# Patient Record
Sex: Female | Born: 1985 | Race: Black or African American | Hispanic: No | Marital: Married | Smoking: Current some day smoker
Health system: Southern US, Community
[De-identification: ages and names within clinical notes are randomized; demographics above are authoritative.]

## PROBLEM LIST (undated history)

## (undated) DIAGNOSIS — B019 Varicella without complication: Secondary | ICD-10-CM

## (undated) HISTORY — DX: Varicella without complication: B01.9

---
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
## 2004-01-25 ENCOUNTER — Observation Stay: Payer: Self-pay

## 2004-02-25 ENCOUNTER — Observation Stay: Payer: Self-pay

## 2004-02-26 ENCOUNTER — Observation Stay: Payer: Self-pay

## 2004-03-11 ENCOUNTER — Observation Stay: Payer: Self-pay

## 2004-03-22 ENCOUNTER — Observation Stay: Payer: Self-pay | Admitting: Unknown Physician Specialty

## 2004-03-22 ENCOUNTER — Emergency Department: Payer: Self-pay | Admitting: Emergency Medicine

## 2004-03-28 ENCOUNTER — Observation Stay: Payer: Self-pay

## 2004-03-31 ENCOUNTER — Inpatient Hospital Stay: Payer: Self-pay

## 2004-06-03 ENCOUNTER — Emergency Department: Payer: Self-pay | Admitting: General Practice

## 2005-09-29 ENCOUNTER — Observation Stay: Payer: Self-pay | Admitting: Obstetrics & Gynecology

## 2005-10-03 ENCOUNTER — Observation Stay: Payer: Self-pay | Admitting: Unknown Physician Specialty

## 2005-10-04 ENCOUNTER — Ambulatory Visit: Payer: Self-pay | Admitting: Unknown Physician Specialty

## 2005-10-24 ENCOUNTER — Observation Stay: Payer: Self-pay | Admitting: Unknown Physician Specialty

## 2005-10-31 ENCOUNTER — Observation Stay: Payer: Self-pay

## 2005-11-08 ENCOUNTER — Observation Stay: Payer: Self-pay | Admitting: Obstetrics & Gynecology

## 2005-11-13 ENCOUNTER — Observation Stay: Payer: Self-pay

## 2005-11-29 ENCOUNTER — Observation Stay: Payer: Self-pay

## 2005-12-01 ENCOUNTER — Inpatient Hospital Stay: Payer: Self-pay

## 2007-12-08 IMAGING — US US OB US >=[ID] SNGL FETUS
1 series · 17 of 28 positions shown · non-contrast
Comparison: none

REASON FOR EXAM: Pre term labor  CALL REPORT [id number] EXT:[id number]
COMMENTS:

[Series 1: us ob us >=(id) sngl fetus · 17 of 61 slices shown]
[im 1/61]
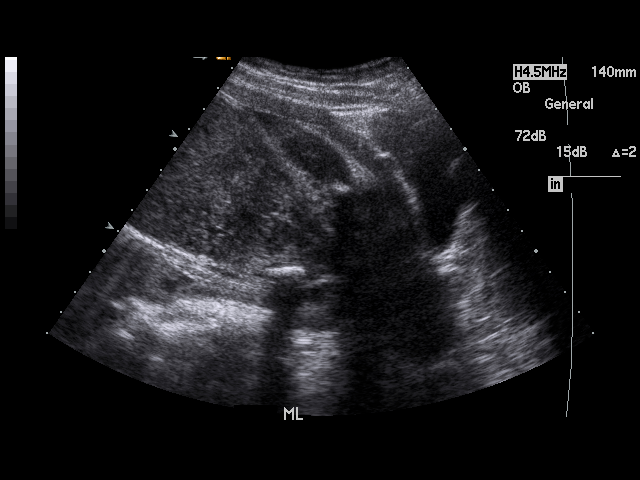
[im 5/61]
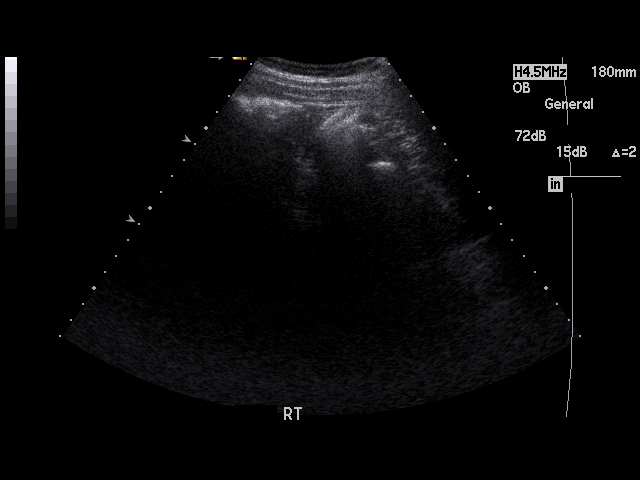
[im 9/61]
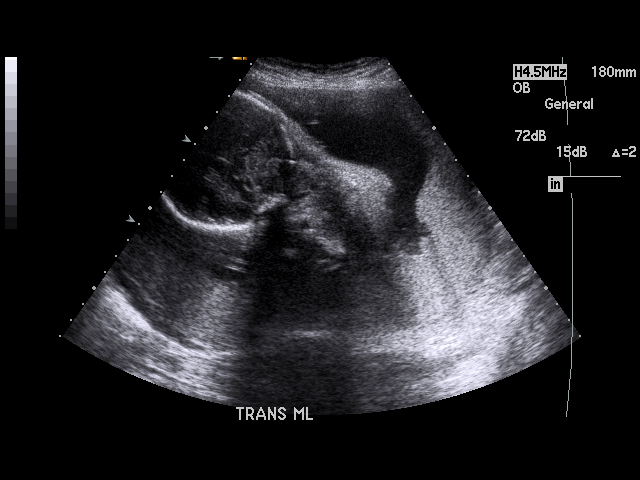
[im 12/61]
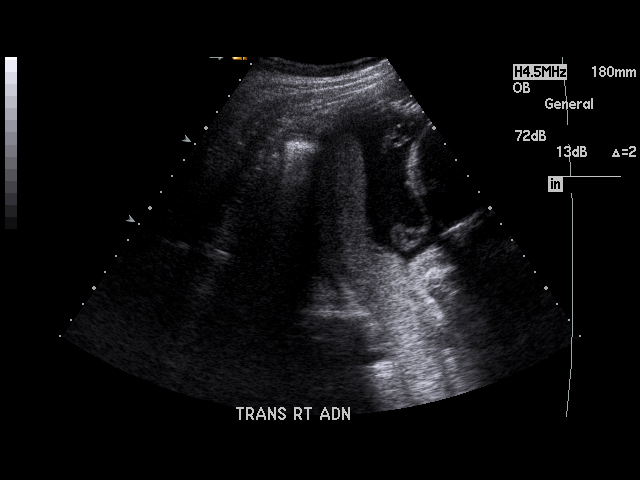
[im 16/61]
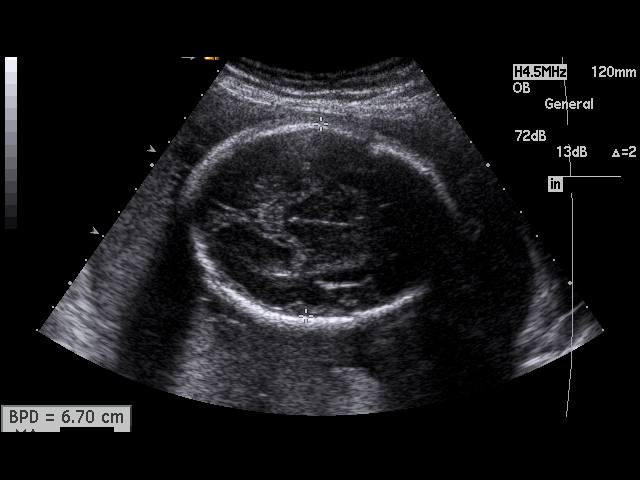
[im 21/61]
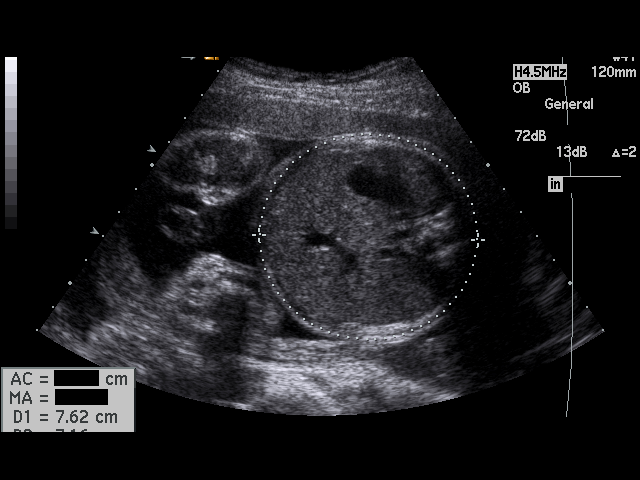
[im 23/61]
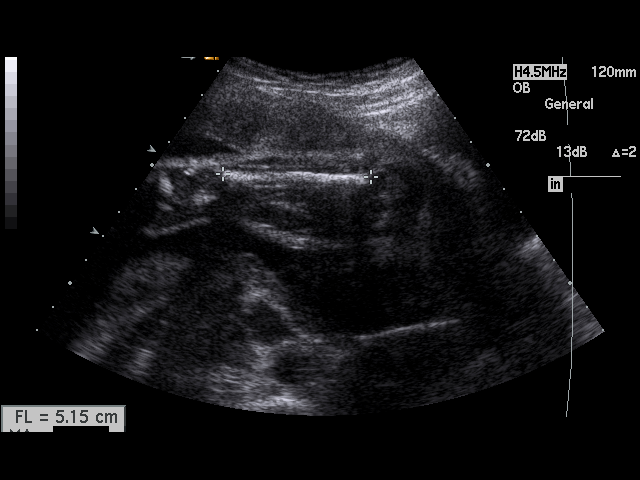
[im 27/61]
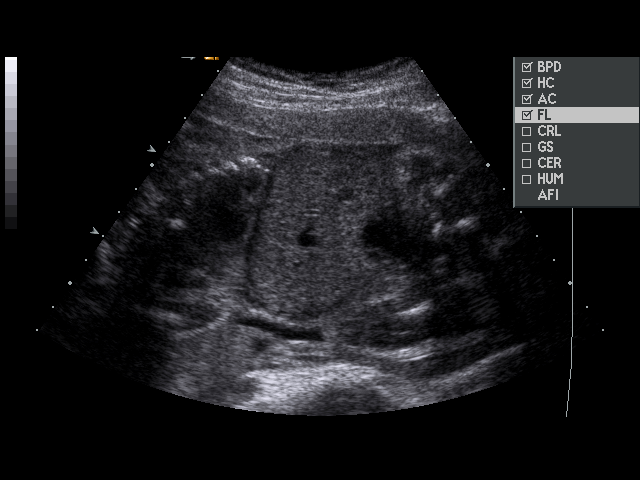
[im 32/61]
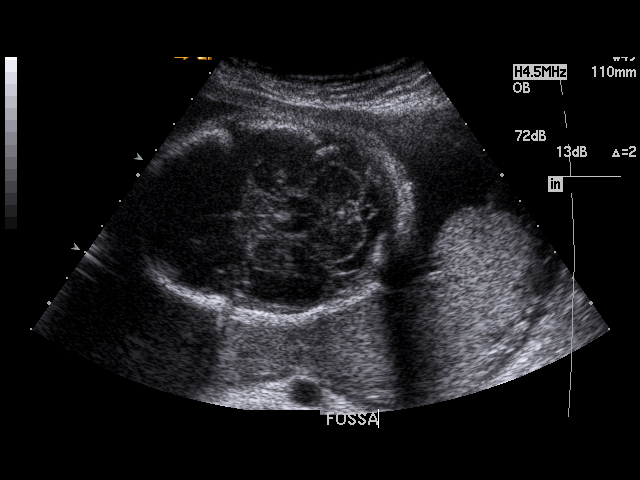
[im 34/61]
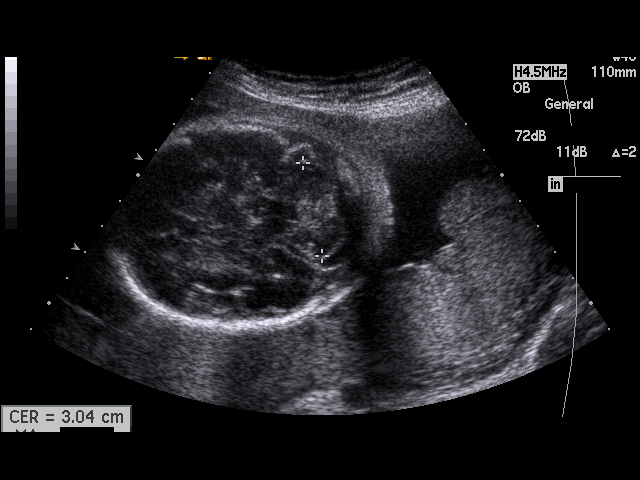
[im 38/61]
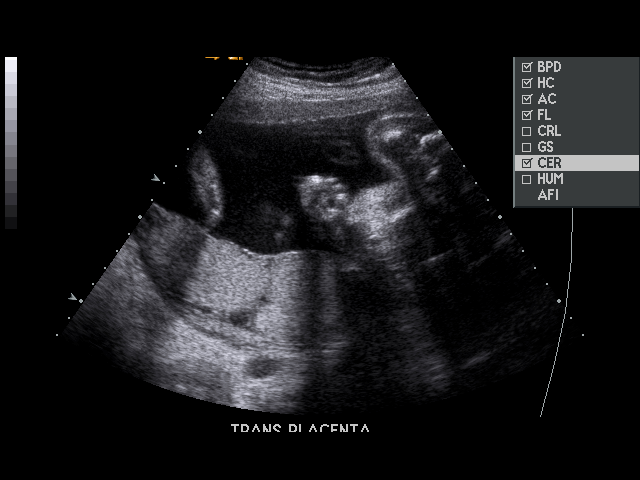
[im 41/61]
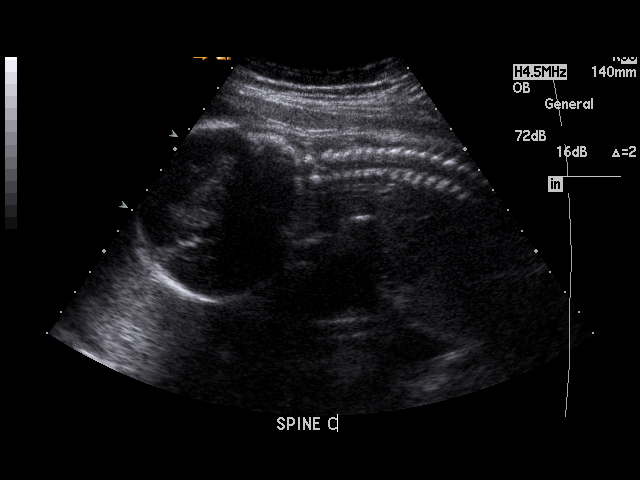
[im 45/61]
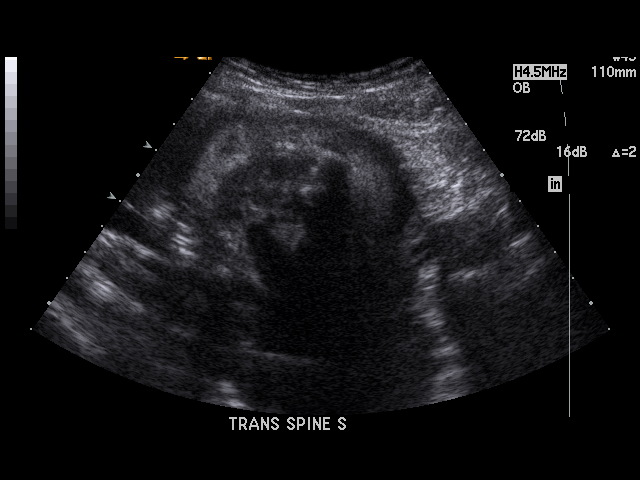
[im 49/61]
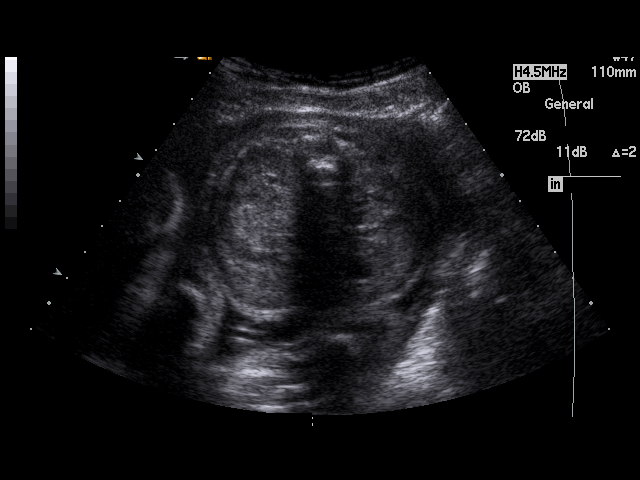
[im 52/61]
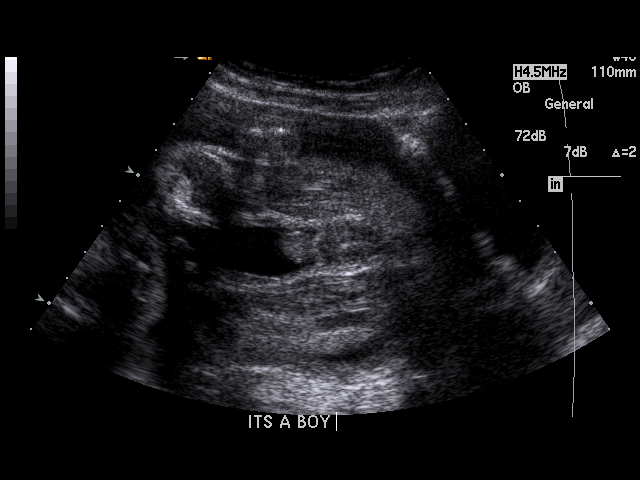
[im 56/61]
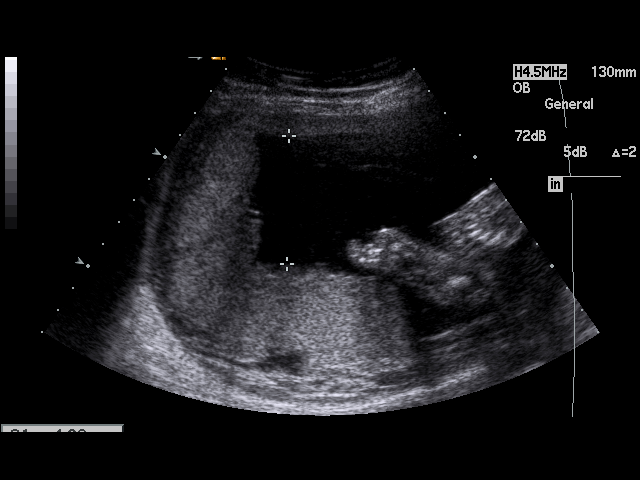
[im 61/61]
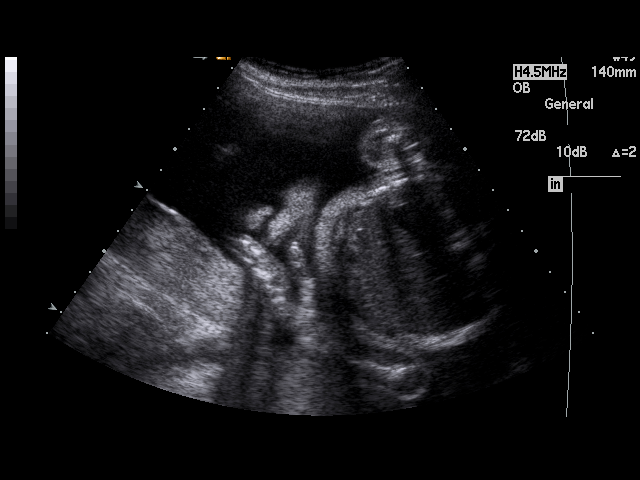

[17 of 28 positions shown; findings below may reference images not displayed]

PROCEDURE:     US  - US OB GREATER/OR EQUAL TO [id number]  - October 04, 2005  [DATE]

RESULT:          There is a living intrauterine gestation.  Presentation
currently is breech.  The placenta is posterior.  Amniotic fluid volume
appears normal.  Fetal heart rate was monitored at 144 beats per minute.
Fetal measurements are as follows.

BPD 6.79 cm (27 weeks, 2 days).
HC 25.54 cm (27 weeks, 5 days).
AC 23.31 cm (27 weeks, 5 days).
FL 5.08 cm (27 weeks, 2 days).
Estimated fetal weight equals 4354 grams.
AFI equals 15.38 cm, which is between the 50th and 95th percentile.

The fetal heart, stomach, and urinary bladder are visualized.  No
hydrocephalus or hydronephrosis is seen.  A normal-appearing three-vessel
cord is noted.
IMPRESSION: Please see above.

## 2010-11-11 ENCOUNTER — Observation Stay: Payer: Self-pay | Admitting: Obstetrics and Gynecology

## 2010-11-14 ENCOUNTER — Inpatient Hospital Stay: Payer: Self-pay | Admitting: Obstetrics and Gynecology

## 2010-11-15 HISTORY — PX: TUBAL LIGATION: SHX77

## 2010-11-18 LAB — PATHOLOGY REPORT

## 2012-01-03 ENCOUNTER — Emergency Department: Payer: Self-pay | Admitting: Unknown Physician Specialty

## 2013-03-09 ENCOUNTER — Emergency Department: Payer: Self-pay | Admitting: Emergency Medicine

## 2013-10-13 ENCOUNTER — Encounter: Payer: Self-pay | Admitting: Internal Medicine

## 2013-10-13 ENCOUNTER — Encounter (INDEPENDENT_AMBULATORY_CARE_PROVIDER_SITE_OTHER): Payer: Self-pay

## 2013-10-13 ENCOUNTER — Ambulatory Visit (INDEPENDENT_AMBULATORY_CARE_PROVIDER_SITE_OTHER): Payer: BC Managed Care – PPO | Admitting: Internal Medicine

## 2013-10-13 VITALS — BP 126/80 | HR 82 | Temp 98.1°F | Resp 16 | Ht 62.0 in | Wt 223.5 lb

## 2013-10-13 DIAGNOSIS — G43109 Migraine with aura, not intractable, without status migrainosus: Secondary | ICD-10-CM

## 2013-10-13 DIAGNOSIS — Z23 Encounter for immunization: Secondary | ICD-10-CM

## 2013-10-13 DIAGNOSIS — F4323 Adjustment disorder with mixed anxiety and depressed mood: Secondary | ICD-10-CM

## 2013-10-13 DIAGNOSIS — E669 Obesity, unspecified: Secondary | ICD-10-CM

## 2013-10-13 MED ORDER — SUMATRIPTAN SUCCINATE 25 MG PO TABS: 25.0000 mg | ORAL_TABLET | ORAL | Status: DC | PRN

## 2013-10-13 MED ORDER — ATENOLOL 25 MG PO TABS: 25.0000 mg | ORAL_TABLET | Freq: Every day | ORAL | Status: DC

## 2013-10-13 NOTE — Patient Instructions (Signed)
I am starting you on nightly atenolol for headache prevention  i am prescribing generic imitrex to treat your next migraine.  You can repeat the dose after 2 hours if the pain is not better   I want to help  you lose 60 lbs over the next 12 months through diet and exercise.  We will add an appetite suppressant if you are unable to lose any weight over the next month   This is  my version of a  "Low GI"  Diet:  It will still lower your blood sugars and allow you to lose 4 to 8  lbs  per month if you follow it carefully.  Your goal with exercise is a minimum of 30 minutes of aerobic exercise 5 days per week (Walking does not count once it becomes easy!)     All of the foods can be found at grocery stores and in bulk at [employer].  The Atkins protein bars and shakes are available in more varieties at Target, WalMart and [employer]'s Foods.     7 AM Breakfast:  Choose from the following:  Low carbohydrate Protein  Shakes (I recommend the EAS AdvantEdge "Carb Control" shakes  Or the low carb shakes by Atkins.    2.5 carbs   Arnold's "Sandwhich Thin"toasted  w/ peanut butter (no jelly: about 20 net carbs  "Bagel Thin" with cream cheese and salmon: about 20 carbs   a scrambled egg/bacon/cheese burrito made with Mission's "carb balance" whole wheat tortilla  (about 10 net carbs )  A slice of home made fritatta (egg based dish without a crust:  google it)    Avoid cereal and bananas, oatmeal and cream of wheat and grits. They are loaded with carbohydrates!   10 AM: high protein snack  Protein bar by Atkins (the snack size, under 200 cal, usually < 6 net carbs).    A stick of cheese:  Around 1 carb,  100 cal     Dannon Light n Fit [place]Greek Yogurt  (80 cal, 8 carbs)  Other so called "protein bars" and Greek yogurts tend to be loaded with carbohydrates.  Remember, in food advertising, the word "energy" is synonymous for " carbohydrate."  Lunch:   A Sandwich using the bread choices listed, Can use any  Eggs,   lunchmeat, grilled meat or canned tuna), avocado, regular mayo/mustard  and cheese.  A Salad using blue cheese, ranch,  Goddess or vinagrette,  No croutons or "confetti" and no "candied nuts" but regular nuts OK.   No pretzels or chips.  Pickles and miniature sweet peppers are a good low carb alternative that provide a "crunch"  The bread is the only source of carbohydrate in a sandwich and  can be decreased by trying some of these alternatives to traditional loaf bread  Joseph's makes a pita bread and a flat bread that are 50 cal and 4 net carbs available at BJs and WalMart.  This can be toasted to use with hummous as well  Toufayan makes a low carb flatbread that's 100 cal and 9 net carbs available at [employer] and [employer]  Mission makes 2 sizes of  Low carb whole wheat tortilla  (The large one is 210 cal and 6 net carbs)  Flat Out makes flatbreads that are low carb as well  Avoid "Low fat dressings, as well as Reyne DumasCatalina and [address] dressings They are loaded with sugar!   3 PM/ Mid day  Snack:  Consider  1 ounce of  almonds, walnuts, pistachios, pecans, peanuts,  Macadamia nuts or a nut medley.  Avoid "granola"; the dried cranberries and raisins are loaded with carbohydrates. Mixed nuts as long as there are no raisins,  cranberries or dried fruit.    Try the prosciutto/mozzarella cheese sticks by Fiorruci  In deli /backery section   High protein   To avoid overindulging in snacks: Try drinking a glass of unsweeted almond/coconut milk  Or a cup of coffee with your Atkins chocolate bar to keep you from having 3!!!   Pork rinds!  Yes Pork Rinds        6 PM  Dinner:     Meat/fowl/fish with a green salad, and either broccoli, cauliflower, green beans, spinach, brussel sprouts or  Lima beans. DO NOT BREAD THE PROTEIN!!      There is a low carb pasta by Dreamfield's that is acceptable and tastes great: only 5 digestible carbs/serving.( All grocery stores but BJs carry it )  Try Kai LevinsMichel Angelo's  chicken piccata or chicken or eggplant parm over low carb pasta.(Lowes and BJs)   Clifton CustardAaron Sanchez's "Carnitas" (pulled pork, no sauce,  0 carbs) or his beef pot roast to make a dinner burrito (at BJ's)  Pesto over low carb pasta (bj's sells a good quality pesto in the center refrigerated section of the deli   Try satueeing  Roosvelt HarpsBok Choy with mushroooms  Whole wheat pasta is still full of digestible carbs and  Not as low in glycemic index as Dreamfield's.   Brown rice is still rice,  So skip the rice and noodles if you eat Congohinese or [place] (or at least limit to 1/2 cup)  9 PM snack :   Breyer's "low carb" fudgsicle or  ice cream bar (Carb Smart line), or  Weight Watcher's ice cream bar , or another "no sugar added" ice cream;  a serving of fresh berries/cherries with whipped cream   Cheese or DANNON'S LlGHT N FIT GREEK YOGURT or the Oikos greek yogurt   8 ounces of [place] unsweetened almond/cococunut milk    Avoid bananas, pineapple, grapes  and watermelon on a regular basis because they are high in sugar.  THINK OF THEM AS DESSERT  Remember that snack Substitutions should be less than 10 NET carbs per serving and meals < 20 carbs. Remember to subtract fiber grams to get the "net carbs."

## 2013-10-13 NOTE — Progress Notes (Signed)
Pre-visit discussion using our clinic review tool. No additional management support is needed unless otherwise documented below in the visit note.  

## 2013-10-13 NOTE — Progress Notes (Signed)
Patient ID: Lisa Rosales, female   DOB: 03/09/1985, 28 y.o.   MRN: [id number]  Patient Active Problem List   Diagnosis Date Noted  . Obesity 10/14/2013  . Migraine headache with aura 10/14/2013  . Adjustment disorder with mixed anxiety and depressed mood 10/14/2013    Subjective:  CC:   Chief Complaint  Patient presents with  . Establish Care    HPI:   Lisa CraterChelsi N Tateis a 28 y.o. 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  The children in school are involved in sports activities after school which require parens to attend practices and meets several times per week.   History of migraines .  Averages 2/month.  Has h aura of seeing dots,  Last one was a week ago .  Missed a 1/2 day of work . Used advil for migraines 2 doses,  Light sensitive,  No fatigue.   bedtime 11:30 or 12  Up at 6 .  Sleepy during day? 4 /5 days,  Told she snores by husband,wakes up tired.  Wakes up at 9 am on weekends. Cranky when wakes up, having some mood swings,  Gets really cranky when tired,  Feels like she wants to be left alone.  4 kids at home from    Obesity:  Was 100 lbs in high school.  160 lbs prior to lat pregnancy 2 years ago,  Pregnancy was normal except for wt loss due to Hyperemesis gravidum  HM:  Last PAP was in 2012 6 weeks post vaginal delivery,  PAP was normal            Past Medical History  Diagnosis Date  . Chicken pox 1995   Allergies  Allergen Reactions  . Morphine And Related Palpitations    Sweats and halucinations     Past  Surgical History  Procedure Laterality Date  . Tubal ligation  11/15/2010    History   Social History  . Marital Status: Married    Spouse Name: N/A    Number of Children: N/A  . Years of Education: N/A   Occupational History  . Not on file.   Social History Main Topics  . Smoking status: Current Some Day Smoker    Types: Cigarettes    Start date: 10/14/2010  . Smokeless tobacco: Never Used     Comment: Occasionally  . Alcohol Use: Yes  . Drug Use: No  . Sexual Activity: Yes   Other Topics Concern  . Not on file   Social History Narrative  . No narrative on file    Family History  Problem Relation Age of Onset  . Arthritis Mother   . Early death Maternal Uncle [id number]    pulmonary embolism  . Seizures Maternal Uncle 25    25-30 seizure onset  . Hyperlipidemia Maternal Grandmother   . Hypertension Maternal Grandmother   . Stroke Maternal Grandmother   . Mental illness Maternal Grandmother   . Diabetes Maternal Grandmother   . Arthritis Maternal Grandfather   .  Hyperlipidemia Maternal Grandfather   . Heart disease Maternal Grandfather   . Hypertension Maternal Grandfather   . Diabetes Maternal Grandfather   . Cancer Paternal Grandmother   . Diabetes Paternal Grandmother   . Diabetes Paternal Grandfather       Review of Systems:   The rest of the review of systems was negative except those addressed in the HPI.    Objective:  BP 126/80  Pulse 82  Temp(Src) 98.1 F (36.7 C) (Oral)  Resp 16  Ht 5\' 2"  (1.575 m)  Wt 223 lb 8 oz (101.379 kg)  BMI 40.87 kg/m2  SpO2 97%  LMP 09/28/2013  General appearance: alert, cooperative and appears stated age Ears: normal TM's and external ear canals both ears Throat: lips, mucosa, and tongue normal; teeth and gums normal Neck: no adenopathy, no carotid bruit, supple, symmetrical, trachea midline and thyroid not enlarged, symmetric, no tenderness/mass/nodules Back: symmetric, no curvature. ROM normal. No CVA  tenderness. Lungs: clear to auscultation bilaterally Heart: regular rate and rhythm, S1, S2 normal, no murmur, click, rub or gallop Abdomen: soft, non-tender; bowel sounds normal; no masses,  no organomegaly Pulses: 2+ and symmetric Skin: Skin color, texture, turgor normal. No rashes or lesions Lymph nodes: Cervical, supraclavicular, and axillary nodes normal.  Assessment and Plan:  Obesity With snoring and headaches .  I have addressed  BMI and recommended a low glycemic index diet utilizing smaller more frequent meals to increase metabolism.  I have also recommended that patient start exercising with a goal of 30 minutes of aerobic exercise a minimum of 5 days per week. Time management and busy schedule identified as barriers to success.  Screening for lipid disorders, thyroid and diabetes to be done    Migraine headache with aura Averaging two per momth with loss of productivity noted.  Advised trial of atenolol qpm and imitrex prn   Adjustment disorder with mixed anxiety and depressed mood Patient's crankiness and fatigue appear to be manifestations of poor sleep and possibly OSA.  Deferred medication for now given risk of wt gain.  Will reassess in one month.    Updated Medication List Outpatient Encounter Prescriptions as of 10/13/2013  Medication Sig  . atenolol (TENORMIN) 25 MG tablet Take 1 tablet (25 mg total) by mouth daily. At bedtime  . SUMAtriptan (IMITREX) 25 MG tablet Take 1 tablet (25 mg total) by mouth every 2 (two) hours as needed for migraine or headache. May repeat in 2 hours if headache persists or recurs.  . [DISCONTINUED] SUMAtriptan (IMITREX) 25 MG tablet Take 1 tablet (25 mg total) by mouth every 2 (two) hours as needed for migraine or headache. May repeat in 2 hours if headache persists or recurs.     No orders of the defined types were placed in this encounter.    No Follow-up on file.

## 2013-10-14 ENCOUNTER — Encounter: Payer: Self-pay | Admitting: Internal Medicine

## 2013-10-14 ENCOUNTER — Telehealth: Payer: Self-pay | Admitting: *Deleted

## 2013-10-14 DIAGNOSIS — G43109 Migraine with aura, not intractable, without status migrainosus: Secondary | ICD-10-CM | POA: Insufficient documentation

## 2013-10-14 DIAGNOSIS — E669 Obesity, unspecified: Secondary | ICD-10-CM | POA: Insufficient documentation

## 2013-10-14 DIAGNOSIS — R5383 Other fatigue: Secondary | ICD-10-CM

## 2013-10-14 DIAGNOSIS — F4323 Adjustment disorder with mixed anxiety and depressed mood: Secondary | ICD-10-CM | POA: Insufficient documentation

## 2013-10-14 NOTE — Assessment & Plan Note (Signed)
Averaging two per momth with loss of productivity noted.  Advised trial of atenolol qpm and imitrex prn

## 2013-10-14 NOTE — Assessment & Plan Note (Addendum)
With snoring and headaches .  I have addressed  BMI and recommended a low glycemic index diet utilizing smaller more frequent meals to increase metabolism.  I have also recommended that patient start exercising with a goal of 30 minutes of aerobic exercise a minimum of 5 days per week. Time management and busy schedule identified as barriers to success.  Screening for lipid disorders, thyroid and diabetes to be done

## 2013-10-14 NOTE — Assessment & Plan Note (Signed)
Patient's crankiness and fatigue appear to be manifestations of poor sleep and possibly OSA.  Deferred medication for now given risk of wt gain.  Will reassess in one month.

## 2013-10-14 NOTE — Telephone Encounter (Signed)
Pt is coming in tomorrow what labs and dx?  

## 2013-10-15 ENCOUNTER — Other Ambulatory Visit (INDEPENDENT_AMBULATORY_CARE_PROVIDER_SITE_OTHER): Payer: BC Managed Care – PPO

## 2013-10-15 DIAGNOSIS — E669 Obesity, unspecified: Secondary | ICD-10-CM

## 2013-10-15 DIAGNOSIS — D509 Iron deficiency anemia, unspecified: Secondary | ICD-10-CM

## 2013-10-15 DIAGNOSIS — R5383 Other fatigue: Secondary | ICD-10-CM

## 2013-10-15 LAB — LIPID PANEL
CHOL/HDL RATIO: 5
Cholesterol: 211 mg/dL — ABNORMAL HIGH (ref 0–200)
HDL: 41.3 mg/dL (ref >39.00)
LDL Cholesterol: 151 mg/dL — ABNORMAL HIGH (ref 0–99)
NONHDL: 169.7
TRIGLYCERIDES: 96 mg/dL (ref 0.0–149.0)
VLDL: 19.2 mg/dL (ref 0.0–40.0)

## 2013-10-15 LAB — CBC WITH DIFFERENTIAL/PLATELET
BASOS ABS: 0.1 10*3/uL (ref 0.0–0.1)
Basophils Relative: 1.1 % (ref 0.0–3.0)
EOS ABS: 0.1 10*3/uL (ref 0.0–0.7)
Eosinophils Relative: 2.3 % (ref 0.0–5.0)
HCT: 35.4 % — ABNORMAL LOW (ref 36.0–46.0)
HEMOGLOBIN: 11.1 g/dL — AB (ref 12.0–15.0)
LYMPHS ABS: 2.1 10*3/uL (ref 0.7–4.0)
Lymphocytes Relative: 44.8 % (ref 12.0–46.0)
MCHC: 31.4 g/dL (ref 30.0–36.0)
MCV: 74.6 fl — AB (ref 78.0–100.0)
Monocytes Absolute: 0.6 10*3/uL (ref 0.1–1.0)
Monocytes Relative: 12.4 % — ABNORMAL HIGH (ref 3.0–12.0)
NEUTROS ABS: 1.9 10*3/uL (ref 1.4–7.7)
Neutrophils Relative %: 39.4 % — ABNORMAL LOW (ref 43.0–77.0)
Platelets: 322 10*3/uL (ref 150.0–400.0)
RBC: 4.75 Mil/uL (ref 3.87–5.11)
RDW: 19 % — ABNORMAL HIGH (ref 11.5–15.5)
WBC: 4.8 10*3/uL (ref 4.0–10.5)

## 2013-10-15 LAB — COMPREHENSIVE METABOLIC PANEL
ALT: 33 U/L (ref 0–35)
AST: 20 U/L (ref 0–37)
Albumin: 3.5 g/dL (ref 3.5–5.2)
Alkaline Phosphatase: 53 U/L (ref 39–117)
BUN: 11 mg/dL (ref 6–23)
CALCIUM: 9 mg/dL (ref 8.4–10.5)
CHLORIDE: 105 meq/L (ref 96–112)
CO2: 24 mEq/L (ref 19–32)
Creatinine, Ser: 0.7 mg/dL (ref 0.4–1.2)
GFR: 129.24 mL/min (ref >60.00)
Glucose, Bld: 83 mg/dL (ref 70–99)
POTASSIUM: 3.9 meq/L (ref 3.5–5.1)
Sodium: 135 mEq/L (ref 135–145)
Total Bilirubin: 0.7 mg/dL (ref 0.2–1.2)
Total Protein: 7.8 g/dL (ref 6.0–8.3)

## 2013-10-15 LAB — TSH: TSH: 0.51 u[IU]/mL (ref 0.35–4.50)

## 2013-10-15 LAB — VITAMIN B12: Vitamin B-12: 526 pg/mL (ref 211–911)

## 2013-10-16 ENCOUNTER — Encounter: Payer: Self-pay | Admitting: Internal Medicine

## 2013-10-16 DIAGNOSIS — D509 Iron deficiency anemia, unspecified: Secondary | ICD-10-CM | POA: Insufficient documentation

## 2013-10-16 NOTE — Addendum Note (Signed)
Addended by: Sherlene ShamsULLO, Loron Weimer L on: 10/16/2013 03:09 PM   Modules accepted: Orders

## 2013-11-26 ENCOUNTER — Ambulatory Visit (INDEPENDENT_AMBULATORY_CARE_PROVIDER_SITE_OTHER): Payer: BC Managed Care – PPO | Admitting: Internal Medicine

## 2013-11-26 ENCOUNTER — Other Ambulatory Visit (HOSPITAL_COMMUNITY)
Admission: RE | Admit: 2013-11-26 | Discharge: 2013-11-26 | Disposition: A | Discharge status: Home / Self Care | Payer: BC Managed Care – PPO | Source: Ambulatory Visit | Attending: Internal Medicine | Admitting: Internal Medicine

## 2013-11-26 ENCOUNTER — Encounter: Payer: Self-pay | Admitting: Internal Medicine

## 2013-11-26 VITALS — BP 118/70 | HR 100 | Temp 98.4°F | Resp 16 | Ht 62.0 in | Wt 218.5 lb

## 2013-11-26 DIAGNOSIS — Z0000 Encounter for general adult medical examination without abnormal findings: Secondary | ICD-10-CM

## 2013-11-26 DIAGNOSIS — Z01419 Encounter for gynecological examination (general) (routine) without abnormal findings: Secondary | ICD-10-CM | POA: Insufficient documentation

## 2013-11-26 DIAGNOSIS — Z1151 Encounter for screening for human papillomavirus (HPV): Secondary | ICD-10-CM | POA: Insufficient documentation

## 2013-11-26 DIAGNOSIS — Z124 Encounter for screening for malignant neoplasm of cervix: Secondary | ICD-10-CM

## 2013-11-26 DIAGNOSIS — E669 Obesity, unspecified: Secondary | ICD-10-CM

## 2013-11-26 DIAGNOSIS — F4323 Adjustment disorder with mixed anxiety and depressed mood: Secondary | ICD-10-CM

## 2013-11-26 MED ORDER — VENLAFAXINE HCL ER 37.5 MG PO CP24: 37.5000 mg | ORAL_CAPSULE | Freq: Every day | ORAL | Status: DC

## 2013-11-26 NOTE — Patient Instructions (Signed)
Trial of generic effexor to help your anxiety level  Increase to 2 tablets/capsules after one week if tolerated   Call or e mail in 3 weeks about how it is helping

## 2013-11-26 NOTE — Progress Notes (Signed)
Pre-visit discussion using our clinic review tool. No additional management support is needed unless otherwise documented below in the visit note.  

## 2013-11-28 ENCOUNTER — Encounter: Payer: Self-pay | Admitting: Internal Medicine

## 2013-11-28 DIAGNOSIS — Z0000 Encounter for general adult medical examination without abnormal findings: Secondary | ICD-10-CM | POA: Insufficient documentation

## 2013-11-28 NOTE — Assessment & Plan Note (Signed)
Discussed trial of effexor

## 2013-11-28 NOTE — Progress Notes (Signed)
Patient ID: Lisa Rosales, female   DOB: 1985-02-20, 28 y.o.   MRN: [id number]  Subjective:     Lisa Rosales is a 28 y.o. female and is here for a comprehensive physical exam. The patient reports increased stressors at home due to husband's recent palimony dispute started by a with a former girlfriend who recently claims to have a 29 yr old  daughter from their previous relationship. She has been more irritable ,  Not sleeping well, and having difficulty managing conflict at work .   History   Social History  . Marital Status: Married    Spouse Name: N/A    Number of Children: N/A  . Years of Education: N/A   Occupational History  . Not on file.   Social History Main Topics  . Smoking status: Current Some Day Smoker    Types: Cigarettes    Start date: 10/14/2010  . Smokeless tobacco: Never Used     Comment: Occasionally  . Alcohol Use: Yes  . Drug Use: No  . Sexual Activity: Yes   Other Topics Concern  . Not on file   Social History Narrative   Health Maintenance  Topic Date Due  . PAP SMEAR  08/29/2002  . TETANUS/TDAP  08/29/2003  . INFLUENZA VACCINE  08/03/2014    The following portions of the patient's history were reviewed and updated as appropriate: allergies, current medications, past family history, past medical history, past social history, past surgical history and problem list.  Review of Systems A comprehensive review of systems was negative.   Objective:   BP 118/70 mmHg  Pulse 100  Temp(Src) 98.4 F (36.9 C) (Oral)  Resp 16  Ht 5\' 2"  (1.575 m)  Wt 218 lb 8 oz (99.111 kg)  BMI 39.95 kg/m2  SpO2 98%  LMP 10/26/2013  General Appearance:    Alert, cooperative, no distress, appears stated age  Head:    Normocephalic, without obvious abnormality, atraumatic  Eyes:    PERRL, conjunctiva/corneas clear, EOM's intact, fundi    benign, both eyes  Ears:    Normal TM's and external ear canals, both ears  Nose:   Nares normal, septum midline, mucosa normal,  no drainage    or sinus tenderness  Throat:   Lips, mucosa, and tongue normal; teeth and gums normal  Neck:   Supple, symmetrical, trachea midline, no adenopathy;    thyroid:  no enlargement/tenderness/nodules; no carotid   bruit or JVD  Back:     Symmetric, no curvature, ROM normal, no CVA tenderness  Lungs:     Clear to auscultation bilaterally, respirations unlabored  Chest Wall:    No tenderness or deformity   Heart:    Regular rate and rhythm, S1 and S2 normal, no murmur, rub   or gallop  Breast Exam:    No tenderness, masses, or nipple abnormality  Abdomen:     Soft, non-tender, bowel sounds active all four quadrants,    no masses, no organomegaly  Genitalia:    Pelvic: cervix normal in appearance, external genitalia normal, no adnexal masses or tenderness, no cervical motion tenderness, rectovaginal septum normal, uterus normal size, shape, and consistency and vagina normal without discharge  Extremities:   Extremities normal, atraumatic, no cyanosis or edema  Pulses:   2+ and symmetric all extremities  Skin:   Skin color, texture, turgor normal, no rashes or lesions  Lymph nodes:   Cervical, supraclavicular, and axillary nodes normal  Neurologic:   CNII-XII intact, normal strength,  sensation and reflexes    throughout     Assessment  and Plan:   Adjustment disorder with mixed anxiety and depressed mood Discussed trial of effexor  Visit for preventive health examination Annual wellness  exam was done as well as a comprehensive physical exam and management of acute and chronic conditions .  During the course of the visit the patient was educated and counseled about appropriate screening and preventive services including :  diabetes screening, lipid analysis with projected  10 year  risk for CAD , nutrition counseling, colorectal cancer screening, and recommended immunizations.  Printed recommendations for health maintenance screenings was given.    Obesity I have congratulated  her in losing 5 lbs in the last month and encouraged  Continued weight loss with goal of 10% of body weight over the next 6 months using a low glycemic index diet and regular exercise a minimum of 5 days per week.     Updated Medication List Outpatient Encounter Prescriptions as of 11/26/2013  Medication Sig  . atenolol (TENORMIN) 25 MG tablet Take 1 tablet (25 mg total) by mouth daily. At bedtime  . SUMAtriptan (IMITREX) 25 MG tablet Take 1 tablet (25 mg total) by mouth every 2 (two) hours as needed for migraine or headache. May repeat in 2 hours if headache persists or recurs.  . venlafaxine XR (EFFEXOR-XR) 37.5 MG 24 hr capsule Take 1 capsule (37.5 mg total) by mouth daily with breakfast. For one week, then in crease to 2 tablets daily

## 2013-11-28 NOTE — Assessment & Plan Note (Signed)

## 2013-11-28 NOTE — Assessment & Plan Note (Signed)
I have congratulated her in losing 5 lbs in the last month and encouraged  Continued weight loss with goal of 10% of body weight over the next 6 months using a low glycemic index diet and regular exercise a minimum of 5 days per week.

## 2013-12-02 ENCOUNTER — Encounter: Payer: Self-pay | Admitting: Internal Medicine

## 2013-12-02 LAB — CYTOLOGY - PAP

## 2013-12-04 ENCOUNTER — Telehealth: Payer: Self-pay | Admitting: Internal Medicine

## 2013-12-04 NOTE — Telephone Encounter (Signed)
emmi emailed °

## 2014-09-23 ENCOUNTER — Encounter: Payer: Self-pay | Admitting: Internal Medicine

## 2014-09-24 ENCOUNTER — Ambulatory Visit (INDEPENDENT_AMBULATORY_CARE_PROVIDER_SITE_OTHER): Payer: BLUE CROSS/BLUE SHIELD | Admitting: Nurse Practitioner

## 2014-09-24 ENCOUNTER — Encounter: Payer: Self-pay | Admitting: Nurse Practitioner

## 2014-09-24 VITALS — BP 108/80 | HR 75 | Temp 98.5°F | Resp 14 | Ht 66.0 in | Wt 224.6 lb

## 2014-09-24 DIAGNOSIS — D509 Iron deficiency anemia, unspecified: Secondary | ICD-10-CM | POA: Diagnosis not present

## 2014-09-24 DIAGNOSIS — Z13220 Encounter for screening for lipoid disorders: Secondary | ICD-10-CM | POA: Diagnosis not present

## 2014-09-24 DIAGNOSIS — E669 Obesity, unspecified: Secondary | ICD-10-CM

## 2014-09-24 DIAGNOSIS — M722 Plantar fascial fibromatosis: Secondary | ICD-10-CM

## 2014-09-24 DIAGNOSIS — R5382 Chronic fatigue, unspecified: Secondary | ICD-10-CM | POA: Diagnosis not present

## 2014-09-24 DIAGNOSIS — Z131 Encounter for screening for diabetes mellitus: Secondary | ICD-10-CM | POA: Insufficient documentation

## 2014-09-24 DIAGNOSIS — B9789 Other viral agents as the cause of diseases classified elsewhere: Secondary | ICD-10-CM

## 2014-09-24 DIAGNOSIS — J069 Acute upper respiratory infection, unspecified: Secondary | ICD-10-CM

## 2014-09-24 DIAGNOSIS — N898 Other specified noninflammatory disorders of vagina: Secondary | ICD-10-CM

## 2014-09-24 LAB — CBC WITH DIFFERENTIAL/PLATELET
BASOS ABS: 0 10*3/uL (ref 0.0–0.1)
Basophils Relative: 0.6 % (ref 0.0–3.0)
EOS PCT: 3 % (ref 0.0–5.0)
Eosinophils Absolute: 0.2 10*3/uL (ref 0.0–0.7)
HCT: 34.1 % — ABNORMAL LOW (ref 36.0–46.0)
Hemoglobin: 10.9 g/dL — ABNORMAL LOW (ref 12.0–15.0)
Lymphocytes Relative: 38.7 % (ref 12.0–46.0)
Lymphs Abs: 2.3 10*3/uL (ref 0.7–4.0)
MCHC: 31.8 g/dL (ref 30.0–36.0)
MCV: 71.6 fl — ABNORMAL LOW (ref 78.0–100.0)
MONO ABS: 0.5 10*3/uL (ref 0.1–1.0)
MONOS PCT: 8.3 % (ref 3.0–12.0)
NEUTROS ABS: 2.9 10*3/uL (ref 1.4–7.7)
NEUTROS PCT: 49.4 % (ref 43.0–77.0)
PLATELETS: 416 10*3/uL — AB (ref 150.0–400.0)
RBC: 4.77 Mil/uL (ref 3.87–5.11)
RDW: 19.3 % — ABNORMAL HIGH (ref 11.5–15.5)
WBC: 6 10*3/uL (ref 4.0–10.5)

## 2014-09-24 LAB — COMPREHENSIVE METABOLIC PANEL
ALT: 105 U/L — ABNORMAL HIGH (ref 0–35)
AST: 36 U/L (ref 0–37)
Albumin: 4.2 g/dL (ref 3.5–5.2)
Alkaline Phosphatase: 101 U/L (ref 39–117)
BUN: 10 mg/dL (ref 6–23)
CO2: 24 mEq/L (ref 19–32)
Calcium: 9.5 mg/dL (ref 8.4–10.5)
Chloride: 104 mEq/L (ref 96–112)
Creatinine, Ser: 0.61 mg/dL (ref 0.40–1.20)
GFR: 148.04 mL/min (ref >60.00)
GLUCOSE: 83 mg/dL (ref 70–99)
Potassium: 4 mEq/L (ref 3.5–5.1)
SODIUM: 138 meq/L (ref 135–145)
TOTAL PROTEIN: 7.8 g/dL (ref 6.0–8.3)
Total Bilirubin: 0.4 mg/dL (ref 0.2–1.2)

## 2014-09-24 LAB — HEMOGLOBIN A1C: Hgb A1c MFr Bld: 5.8 % (ref 4.6–6.5)

## 2014-09-24 LAB — TSH: TSH: 1.38 u[IU]/mL (ref 0.35–4.50)

## 2014-09-24 LAB — LIPID PANEL
CHOL/HDL RATIO: 4
CHOLESTEROL: 180 mg/dL (ref 0–200)
HDL: 42.2 mg/dL (ref >39.00)
LDL CALC: 110 mg/dL — AB (ref 0–99)
NonHDL: 138.04
TRIGLYCERIDES: 141 mg/dL (ref 0.0–149.0)
VLDL: 28.2 mg/dL (ref 0.0–40.0)

## 2014-09-24 LAB — VITAMIN D 25 HYDROXY (VIT D DEFICIENCY, FRACTURES): VITD: 11.55 ng/mL — AB (ref 30.00–100.00)

## 2014-09-24 MED ORDER — HYDROCOD POLST-CPM POLST ER 10-8 MG/5ML PO SUER: 5.0000 mL | Freq: Every evening | ORAL | Status: DC | PRN

## 2014-09-24 MED ORDER — FLUCONAZOLE 150 MG PO TABS: 150.0000 mg | ORAL_TABLET | Freq: Once | ORAL | Status: DC

## 2014-09-24 MED ORDER — FLUTICASONE PROPIONATE 50 MCG/ACT NA SUSP: 2.0000 | Freq: Every day | NASAL | Status: DC

## 2014-09-24 NOTE — Progress Notes (Signed)
Pre visit review using our clinic review tool, if applicable. No additional management support is needed unless otherwise documented below in the visit note. 

## 2014-09-24 NOTE — Assessment & Plan Note (Signed)
New onset over 3 months of vaginal irritation before and at the beginning of her menstrual cycle. Will try diflucan today. Pelvic will be deferred to her PCP.

## 2014-09-24 NOTE — Progress Notes (Signed)
Patient ID: Lisa Rosales, female    DOB: [birth year]  Age: 29 y.o. MRN: [id number]  CC: URI   HPI Laurie DAJA SHUPING presents for URI symptoms x 6 days, left heel pain, concern for diabetes, and possible yeast infection.   1) Ear pain- sore, echoing sound and congested on left  2) Nasal congestion- Maxillary, left >R pain, husband sick 2 weeks ago. Coughing-green phlegm, body aches on Sat and Sunday, but none after that.  Used Thera flu and Nyquil-helpful   3) Diabetes concern- Left foot, burning in heel x 1 month, hurts when moving from sitting to standing and intermittently  Pt has family with diabetes and is concerned   4) Fatigue- pt has 4 children and feels more fatigued than usual  5) Yeast- LMP- 09/22/14, three months straight of breast tenderness, and vaginal irritation with pruritis.    History Ronnett has a past medical history of Chicken pox (1995).   She has past surgical history that includes Tubal ligation (11/15/2010).   Her family history includes Arthritis in her maternal grandfather and mother; Cancer in her paternal grandmother; Diabetes in her maternal grandfather, maternal grandmother, paternal grandfather, and paternal grandmother; Early death (age of onset: 50) in her maternal uncle; Heart disease in her maternal grandfather; Hyperlipidemia in her maternal grandfather and maternal grandmother; Hypertension in her maternal grandfather and maternal grandmother; Mental illness in her maternal grandmother; Seizures (age of onset: 25) in her maternal uncle; Stroke in her maternal grandmother.She reports that she has been smoking Cigarettes.  She started smoking about 3 years ago. She has never used smokeless tobacco. She reports that she drinks alcohol. She reports that she does not use illicit drugs.  Outpatient Prescriptions Prior to Visit  Medication Sig Dispense Refill  . atenolol (TENORMIN) 25 MG tablet Take 1 tablet (25 mg total) by mouth daily. At bedtime 30 tablet 1  .  SUMAtriptan (IMITREX) 25 MG tablet Take 1 tablet (25 mg total) by mouth every 2 (two) hours as needed for migraine or headache. May repeat in 2 hours if headache persists or recurs. 10 tablet 0  . venlafaxine XR (EFFEXOR-XR) 37.5 MG 24 hr capsule Take 1 capsule (37.5 mg total) by mouth daily with breakfast. For one week, then in crease to 2 tablets daily (Patient not taking: Reported on 09/24/2014) 60 capsule 1   No facility-administered medications prior to visit.    ROS Review of Systems  Constitutional: Negative for fever, chills, diaphoresis and fatigue.  HENT: Positive for congestion, ear pain, postnasal drip, rhinorrhea and sinus pressure. Negative for ear discharge, sneezing, sore throat and trouble swallowing.   Eyes: Negative for visual disturbance.  Respiratory: Positive for cough. Negative for chest tightness, shortness of breath and wheezing.   Cardiovascular: Negative for chest pain, palpitations and leg swelling.  Gastrointestinal: Negative for nausea, vomiting and diarrhea.  Endocrine: Negative for polydipsia, polyphagia and polyuria.  Genitourinary: Negative for dysuria, vaginal discharge, difficulty urinating, genital sores and vaginal pain.       Vaginal irritation  Skin: Negative for rash.  Neurological: Negative for dizziness, weakness, numbness and headaches.  Psychiatric/Behavioral: The patient is not nervous/anxious.     Objective:  BP 108/80 mmHg  Pulse 75  Temp(Src) 98.5 F (36.9 C)  Resp 14  Ht  (1.676 m)  Wt 224 lb 9.6 oz (101.878 kg)  BMI 36.27 kg/m2  SpO2 97%  Physical Exam  Constitutional: She is oriented to person, place, and time. She appears well-developed and well-nourished.  No distress.  HENT:  Head: Normocephalic and atraumatic.  Right Ear: External ear normal.  Left Ear: External ear normal.  TM on left- landmarks visible, serous fluid visualized, no retraction or bulging, canal slightly erythematous  TM on right- landmarks visible, no  retraction or bulging, canal clear  Eyes: EOM are normal. Pupils are equal, round, and reactive to light. Right eye exhibits no discharge. Left eye exhibits no discharge. No scleral icterus.  Neck: Normal range of motion. Neck supple. No thyromegaly present.  Cardiovascular: Normal rate, regular rhythm and normal heart sounds.   Pulmonary/Chest: Effort normal and breath sounds normal. No respiratory distress. She has no wheezes. She has no rales. She exhibits no tenderness.  Genitourinary:  Deferred to her PCP  Lymphadenopathy:    She has no cervical adenopathy.  Neurological: She is alert and oriented to person, place, and time. No cranial nerve deficit. She exhibits normal muscle tone. Coordination normal.  Skin: Skin is warm and dry. No rash noted. She is not diaphoretic.  Psychiatric: She has a normal mood and affect. Her behavior is normal. Judgment and thought content normal.   Assessment & Plan:   Hollis was seen today for uri.  Diagnoses and all orders for this visit:  Microcytic anemia -     CBC with Differential/Platelet  Obesity -     Comprehensive metabolic panel  Chronic fatigue -     Comprehensive metabolic panel -     TSH -     Vitamin D (25 hydroxy)  Screening for hyperlipidemia -     Lipid panel  Screening for diabetes mellitus -     Hemoglobin A1c  Vaginal irritation  Plantar fasciitis  Viral URI with cough  Other orders -     fluconazole (DIFLUCAN) 150 MG tablet; Take 1 tablet (150 mg total) by mouth once. -     fluticasone (FLONASE) 50 MCG/ACT nasal spray; Place 2 sprays into both nostrils daily. -     chlorpheniramine-HYDROcodone (TUSSIONEX PENNKINETIC ER) 10-8 MG/5ML SUER; Take 5 mLs by mouth at bedtime as needed for cough.   I am having Ms. Kadow start on fluconazole, fluticasone, and chlorpheniramine-HYDROcodone. I am also having her maintain her atenolol, SUMAtriptan, and venlafaxine XR.  Meds ordered this encounter  Medications  .  fluconazole (DIFLUCAN) 150 MG tablet    Sig: Take 1 tablet (150 mg total) by mouth once.    Dispense:  1 tablet    Refill:  0    Order Specific Question:  Supervising Provider    Answer:  Darrick Huntsman, TERESA L [[id number]]  . fluticasone (FLONASE) 50 MCG/ACT nasal spray    Sig: Place 2 sprays into both nostrils daily.    Dispense:  16 g    Refill:  6    Order Specific Question:  Supervising Provider    Answer:  Duncan Dull L [[id number]]  . chlorpheniramine-HYDROcodone (TUSSIONEX PENNKINETIC ER) 10-8 MG/5ML SUER    Sig: Take 5 mLs by mouth at bedtime as needed for cough.    Dispense:  115 mL    Refill:  0    Order Specific Question:  Supervising Provider    Answer:  Sherlene Shams [[id number]]     Follow-up: Return in about 2 weeks (around 10/08/2014) for With Dr. Darrick Huntsman for pelvic.

## 2014-09-24 NOTE — Patient Instructions (Addendum)
Take the diflucan for yeast  Don't drive or operate machinery with the cough syrup. If you get palpitations stop the medication.  Flonase- helps open up passages and allow fluid to drain Follow up with Dr. Darrick Huntsman  Upper Respiratory Infection, Adult An upper respiratory infection (URI) is also sometimes known as the common cold. The upper respiratory tract includes the nose, sinuses, throat, trachea, and bronchi. Bronchi are the airways leading to the lungs. Most people improve within 1 week, but symptoms can last up to 2 weeks. A residual cough may last even longer.  CAUSES Many different viruses can infect the tissues lining the upper respiratory tract. The tissues become irritated and inflamed and often become very moist. Mucus production is also common. A cold is contagious. You can easily spread the virus to others by oral contact. This includes kissing, sharing a glass, coughing, or sneezing. Touching your mouth or nose and then touching a surface, which is then touched by another person, can also spread the virus. SYMPTOMS  Symptoms typically develop 1 to 3 days after you come in contact with a cold virus. Symptoms vary from person to person. They may include:  Runny nose.  Sneezing.  Nasal congestion.  Sinus irritation.  Sore throat.  Loss of voice (laryngitis).  Cough.  Fatigue.  Muscle aches.  Loss of appetite.  Headache.  Low-grade fever. DIAGNOSIS  You might diagnose your own cold based on familiar symptoms, since most people get a cold 2 to 3 times a year. Your caregiver can confirm this based on your exam. Most importantly, your caregiver can check that your symptoms are not due to another disease such as strep throat, sinusitis, pneumonia, asthma, or epiglottitis. Blood tests, throat tests, and X-rays are not necessary to diagnose a common cold, but they may sometimes be helpful in excluding other more serious diseases. Your caregiver will decide if any further tests  are required. RISKS AND COMPLICATIONS  You may be at risk for a more severe case of the common cold if you smoke cigarettes, have chronic heart disease (such as heart failure) or lung disease (such as asthma), or if you have a weakened immune system. The very young and very old are also at risk for more serious infections. Bacterial sinusitis, middle ear infections, and bacterial pneumonia can complicate the common cold. The common cold can worsen asthma and chronic obstructive pulmonary disease (COPD). Sometimes, these complications can require emergency medical care and may be life-threatening. PREVENTION  The best way to protect against getting a cold is to practice good hygiene. Avoid oral or hand contact with people with cold symptoms. Wash your hands often if contact occurs. There is no clear evidence that vitamin C, vitamin E, echinacea, or exercise reduces the chance of developing a cold. However, it is always recommended to get plenty of rest and practice good nutrition. TREATMENT  Treatment is directed at relieving symptoms. There is no cure. Antibiotics are not effective, because the infection is caused by a virus, not by bacteria. Treatment may include:  Increased fluid intake. Sports drinks offer valuable electrolytes, sugars, and fluids.  Breathing heated mist or steam (vaporizer or shower).  Eating chicken soup or other clear broths, and maintaining good nutrition.  Getting plenty of rest.  Using gargles or lozenges for comfort.  Controlling fevers with ibuprofen or acetaminophen as directed by your caregiver.  Increasing usage of your inhaler if you have asthma. Zinc gel and zinc lozenges, taken in the first 24 hours of  the common cold, can shorten the duration and lessen the severity of symptoms. Pain medicines may help with fever, muscle aches, and throat pain. A variety of non-prescription medicines are available to treat congestion and runny nose. Your caregiver can make  recommendations and may suggest nasal or lung inhalers for other symptoms.  HOME CARE INSTRUCTIONS   Only take over-the-counter or prescription medicines for pain, discomfort, or fever as directed by your caregiver.  Use a warm mist humidifier or inhale steam from a shower to increase air moisture. This may keep secretions moist and make it easier to breathe.  Drink enough water and fluids to keep your urine clear or pale yellow.  Rest as needed.  Return to work when your temperature has returned to normal or as your caregiver advises. You may need to stay home longer to avoid infecting others. You can also use a face mask and careful hand washing to prevent spread of the virus. SEEK MEDICAL CARE IF:   After the first few days, you feel you are getting worse rather than better.  You need your caregiver's advice about medicines to control symptoms.  You develop chills, worsening shortness of breath, or brown or red sputum. These may be signs of pneumonia.  You develop yellow or brown nasal discharge or pain in the face, especially when you bend forward. These may be signs of sinusitis.  You develop a fever, swollen neck glands, pain with swallowing, or white areas in the back of your throat. These may be signs of strep throat. SEEK IMMEDIATE MEDICAL CARE IF:   You have a fever.  You develop severe or persistent headache, ear pain, sinus pain, or chest pain.  You develop wheezing, a prolonged cough, cough up blood, or have a change in your usual mucus (if you have chronic lung disease).  You develop sore muscles or a stiff neck. Document Released: 06/14/2000 Document Revised: 03/13/2011 Document Reviewed: 03/26/2013 [facility] Patient Information 2015 [place], [place]. This information is not intended to replace advice given to you by your health care provider. Make sure you discuss any questions you have with your health care provider.

## 2014-09-24 NOTE — Assessment & Plan Note (Signed)
Obtain CBC w/ diff today. Will discuss with Dr. Darrick Huntsman once results come back.

## 2014-09-24 NOTE — Assessment & Plan Note (Signed)
Pt is concerned about diabetes since there is a family history. Will obtain A1c and CMET today.

## 2014-09-24 NOTE — Assessment & Plan Note (Signed)
Left heel only. Pt was given information regarding proper shoe wear, rolling a golf ball under the foot, and stretching the foot. Will follow.

## 2014-09-24 NOTE — Assessment & Plan Note (Addendum)
6 days of viral symptoms. Discussed that antibiotics would not be helpful for this and it will resolve in the next week or so. Continue OTC measures, add Flonase to help with drainage of fluid in left ear, and gave script for Tussionex. Informed pt not to drive or operate heavy machinery while taking the syrup due to drowsiness and stop if she has palpitations. Only take 5 mL (1 tsp) instruction given to pt.   Asked pt to call if fever of 101 or worsening after 10 days of symptoms. FU prn worsening/failure to improve.

## 2014-09-27 MED ORDER — ERGOCALCIFEROL 1.25 MG (50000 UT) PO CAPS: 50000.0000 [IU] | ORAL_CAPSULE | ORAL | Status: DC

## 2014-09-27 NOTE — Addendum Note (Signed)
Addended by: Sherlene Shams on: 09/27/2014 03:31 PM   Modules accepted: Orders, SmartSet

## 2014-10-13 ENCOUNTER — Emergency Department: Payer: BLUE CROSS/BLUE SHIELD

## 2014-10-13 ENCOUNTER — Encounter: Payer: Self-pay | Admitting: Emergency Medicine

## 2014-10-13 ENCOUNTER — Telehealth: Payer: Self-pay | Admitting: Internal Medicine

## 2014-10-13 ENCOUNTER — Emergency Department
Admission: EM | Admit: 2014-10-13 | Discharge: 2014-10-13 | Disposition: A | Discharge status: Home / Self Care | Payer: BLUE CROSS/BLUE SHIELD | Attending: Emergency Medicine | Admitting: Emergency Medicine

## 2014-10-13 DIAGNOSIS — F329 Major depressive disorder, single episode, unspecified: Secondary | ICD-10-CM | POA: Insufficient documentation

## 2014-10-13 DIAGNOSIS — M940 Chondrocostal junction syndrome [Tietze]: Secondary | ICD-10-CM | POA: Insufficient documentation

## 2014-10-13 DIAGNOSIS — E669 Obesity, unspecified: Secondary | ICD-10-CM | POA: Diagnosis not present

## 2014-10-13 DIAGNOSIS — R079 Chest pain, unspecified: Secondary | ICD-10-CM | POA: Diagnosis present

## 2014-10-13 DIAGNOSIS — I10 Essential (primary) hypertension: Secondary | ICD-10-CM | POA: Diagnosis not present

## 2014-10-13 DIAGNOSIS — Z87891 Personal history of nicotine dependence: Secondary | ICD-10-CM | POA: Diagnosis not present

## 2014-10-13 DIAGNOSIS — Z886 Allergy status to analgesic agent status: Secondary | ICD-10-CM | POA: Diagnosis not present

## 2014-10-13 DIAGNOSIS — Z79899 Other long term (current) drug therapy: Secondary | ICD-10-CM | POA: Insufficient documentation

## 2014-10-13 DIAGNOSIS — Z7951 Long term (current) use of inhaled steroids: Secondary | ICD-10-CM | POA: Diagnosis not present

## 2014-10-13 DIAGNOSIS — F419 Anxiety disorder, unspecified: Secondary | ICD-10-CM | POA: Diagnosis not present

## 2014-10-13 LAB — COMPREHENSIVE METABOLIC PANEL
ALBUMIN: 4.2 g/dL (ref 3.5–5.0)
ALK PHOS: 70 U/L (ref 38–126)
ALT: 41 U/L (ref 14–54)
ANION GAP: 7 (ref 5–15)
AST: 22 U/L (ref 15–41)
BUN: 7 mg/dL (ref 6–20)
CALCIUM: 9.2 mg/dL (ref 8.9–10.3)
CHLORIDE: 106 mmol/L (ref 101–111)
CO2: 24 mmol/L (ref 22–32)
CREATININE: 0.63 mg/dL (ref 0.44–1.00)
GFR calc non Af Amer: >60 mL/min (ref >60)
GLUCOSE: 94 mg/dL (ref 65–99)
Potassium: 3.8 mmol/L (ref 3.5–5.1)
SODIUM: 137 mmol/L (ref 135–145)
Total Bilirubin: 0.9 mg/dL (ref 0.3–1.2)
Total Protein: 7.9 g/dL (ref 6.5–8.1)

## 2014-10-13 LAB — CBC
HCT: 33.5 % — ABNORMAL LOW (ref 35.0–47.0)
HEMOGLOBIN: 10.6 g/dL — AB (ref 12.0–16.0)
MCH: 22.3 pg — ABNORMAL LOW (ref 26.0–34.0)
MCHC: 31.8 g/dL — ABNORMAL LOW (ref 32.0–36.0)
MCV: 70.1 fL — ABNORMAL LOW (ref 80.0–100.0)
PLATELETS: 361 10*3/uL (ref 150–440)
RBC: 4.78 MIL/uL (ref 3.80–5.20)
RDW: 18.5 % — ABNORMAL HIGH (ref 11.5–14.5)
WBC: 6.5 10*3/uL (ref 3.6–11.0)

## 2014-10-13 LAB — TROPONIN I: Troponin I: <0.03 ng/mL (ref <0.031)

## 2014-10-13 MED ORDER — NAPROXEN 500 MG PO TABS: 500.0000 mg | ORAL_TABLET | Freq: Two times a day (BID) | ORAL | Status: DC

## 2014-10-13 MED ORDER — ORPHENADRINE CITRATE ER 100 MG PO TB12: 100.0000 mg | ORAL_TABLET | Freq: Two times a day (BID) | ORAL | Status: AC

## 2014-10-13 NOTE — Telephone Encounter (Signed)
Patient Name: Lisa Rosales  DOB: 07/17/1985    Initial Comment caller states she had a cramping pain under her breast and in her rib area - now has traveled up to her neck and in her chest on the left side   Nurse Assessment  Nurse: Sherilyn Cooter, RN, Thurmond Butts Date/Time Lamount Cohen Time): 10/13/2014 10:14:35 AM  Confirm and document reason for call. If symptomatic, describe symptoms. ---Caller states that she has left sided chest pain, left sided neck pain and pain in her left shoulder area. This began yesterday. The pain is not constant. She does have the pain at present. She describes her chest pain as a sharp stabbing pain. Taking a deep breath makes the pain worse. She rates her pain as 6-7 on 0-10 scale. Denies fever.  Has the patient traveled out of the country within the last 30 days? ---No  Does the patient have any new or worsening symptoms? ---Yes  Will a triage be completed? ---Yes  Related visit to physician within the last 2 weeks? ---No  Does the PT have any chronic conditions? (i.e. diabetes, asthma, etc.) ---No  Did the patient indicate they were pregnant? ---No     Guidelines    Guideline Title Affirmed Question Affirmed Notes  Chest Pain [1] Intermittent chest pain or "angina" AND [2] increasing in severity or frequency (Exception: pains lasting a few seconds) Intensity increasing now with any breathing.   Final Disposition User   Go to ED Now Sherilyn Cooter, RN, Thurmond Butts    Comments  [phone] Call was found in urgent queue after having to shut down due to I3 update. Attempt made. Message that the mailbox is full.  I cannot locate this patient by either name or DOB. I called the backline and spoke with Shanda Bumps She was not able to locate this patient either. I called the patient to verify name and DOB. We had her last name as [name], but it is [name]. Record was changed to reflect the correct spellling.   Referrals  [facility] - ED   Disagree/Comply: Comply

## 2014-10-13 NOTE — ED Provider Notes (Signed)
[facility] Emergency Department Provider Note  ____________________________________________  Time seen: Approximately 12:46 PM  I have reviewed the triage vital signs and the nursing notes.   HISTORY  Chief Complaint Chest Pain    HPI Bernis ALEXEA BLASE is a 29 y.o. female patient complaining of intermittent left chest wall and breast pain for 2 days. Patient stated pain radiates to the left side of her neck at times patient also stated pain is worse with inspiration. Patient also states she can palpate the area of discomfort. Patient denies any injury or increased physical activity. Patient rates the pain 7/10. Patient describes the pain is dull to sharp. No palliative measures taken for this complaint.   Past Medical History  Diagnosis Date  . Chicken pox 1995    Patient Active Problem List   Diagnosis Date Noted  . Screening for diabetes mellitus 09/24/2014  . Vaginal irritation 09/24/2014  . Plantar fasciitis 09/24/2014  . Viral URI with cough 09/24/2014  . Visit for preventive health examination 11/28/2013  . Microcytic anemia 10/16/2013  . Obesity 10/14/2013  . Migraine headache with aura 10/14/2013  . Adjustment disorder with mixed anxiety and depressed mood 10/14/2013    Past Surgical History  Procedure Laterality Date  . Tubal ligation  11/15/2010    Current Outpatient Rx  Name  Route  Sig  Dispense  Refill  . atenolol (TENORMIN) 25 MG tablet   Oral   Take 1 tablet (25 mg total) by mouth daily. At bedtime   30 tablet   1   . chlorpheniramine-HYDROcodone (TUSSIONEX PENNKINETIC ER) 10-8 MG/5ML SUER   Oral   Take 5 mLs by mouth at bedtime as needed for cough.   115 mL   0   . ergocalciferol (DRISDOL) 50000 UNITS capsule   Oral   Take 1 capsule (50,000 Units total) by mouth once a week.   12 capsule   0   . fluconazole (DIFLUCAN) 150 MG tablet   Oral   Take 1 tablet (150 mg total) by mouth once.   1 tablet   0   .  fluticasone (FLONASE) 50 MCG/ACT nasal spray   Each Nare   Place 2 sprays into both nostrils daily.   16 g   6   . naproxen (NAPROSYN) 500 MG tablet   Oral   Take 1 tablet (500 mg total) by mouth 2 (two) times daily with a meal.   20 tablet   0   . orphenadrine (NORFLEX) 100 MG tablet   Oral   Take 1 tablet (100 mg total) by mouth 2 (two) times daily.   20 tablet   0   . SUMAtriptan (IMITREX) 25 MG tablet   Oral   Take 1 tablet (25 mg total) by mouth every 2 (two) hours as needed for migraine or headache. May repeat in 2 hours if headache persists or recurs.   10 tablet   0   . venlafaxine XR (EFFEXOR-XR) 37.5 MG 24 hr capsule   Oral   Take 1 capsule (37.5 mg total) by mouth daily with breakfast. For one week, then in crease to 2 tablets daily Patient not taking: Reported on 09/24/2014   60 capsule   1     Allergies Morphine and related  Family History  Problem Relation Age of Onset  . Arthritis Mother   . Early death Maternal Uncle 38    pulmonary embolism  . Seizures Maternal Uncle 25    25-30 seizure onset  .  Hyperlipidemia Maternal Grandmother   . Hypertension Maternal Grandmother   . Stroke Maternal Grandmother   . Mental illness Maternal Grandmother   . Diabetes Maternal Grandmother   . Arthritis Maternal Grandfather   . Hyperlipidemia Maternal Grandfather   . Heart disease Maternal Grandfather   . Hypertension Maternal Grandfather   . Diabetes Maternal Grandfather   . Cancer Paternal Grandmother   . Diabetes Paternal Grandmother   . Diabetes Paternal Grandfather     Social History Social History  Substance Use Topics  . Smoking status: Former Smoker    Types: Cigarettes    Start date: 10/14/2010  . Smokeless tobacco: Never Used     Comment: Occasionally  . Alcohol Use: Yes     Comment: occassional    Review of Systems Constitutional: No fever/chills Eyes: No visual changes. ENT: No sore throat. Cardiovascular: Denies chest  pain. Respiratory: Denies shortness of breath. Gastrointestinal: No abdominal pain.  No nausea, no vomiting.  No diarrhea.  No constipation. Genitourinary: Negative for dysuria. Musculoskeletal: Negative for back pain. Skin: Negative for rash. Neurological: Negative for headaches, focal weakness or numbness. Psychiatric: Anxiety depression Endocrine:Hypertension and obesity Hematological/Lymphatic: Allergic/Immunilogical: Morphine 10-point ROS otherwise negative.  ____________________________________________   PHYSICAL EXAM:  VITAL SIGNS: ED Triage Vitals  Enc Vitals Group     BP 10/13/14 1047 147/81 mmHg     Pulse Rate 10/13/14 1047 104     Resp 10/13/14 1047 18     Temp 10/13/14 1047 99.2 F (37.3 C)     Temp Source 10/13/14 1047 Oral     SpO2 10/13/14 1047 99 %     Weight 10/13/14 1047 200 lb (90.719 kg)     Height 10/13/14 1047 5' (1.524 m)     Head Cir --      Peak Flow --      Pain Score 10/13/14 1050 7     Pain Loc --      Pain Edu? --      Excl. in GC? --     Constitutional: Alert and oriented. Well appearing and in no acute distress. Appears anxious Eyes: Conjunctivae are normal. PERRL. EOMI. Head: Atraumatic. Nose: No congestion/rhinnorhea. Mouth/Throat: Mucous membranes are moist.  Oropharynx non-erythematous. Neck: No stridor. No cervical spine tenderness to palpation. Hematological/Lymphatic/Immunilogical: No cervical lymphadenopathy. Cardiovascular: Normal rate, regular rhythm. Grossly normal heart sounds.  Good peripheral circulation. Respiratory: Normal respiratory effort.  No retractions. Lungs CTAB. Gastrointestinal: Soft and nontender. No distention. No abdominal bruits. No CVA tenderness. Musculoskeletal: No lower extremity tenderness nor edema.  No joint effusions. Neurologic:  Normal speech and language. No gross focal neurologic deficits are appreciated. No gait instability. Skin:  Skin is warm, dry and intact. No rash noted. Psychiatric:  Mood and affect are normal. Speech and behavior are normal.  ____________________________________________   LABS (all labs ordered are listed, but only abnormal results are displayed)  Labs Reviewed  CBC - Abnormal; Notable for the following:    Hemoglobin 10.6 (*)    HCT 33.5 (*)    MCV 70.1 (*)    MCH 22.3 (*)    MCHC 31.8 (*)    RDW 18.5 (*)    All other components within normal limits  COMPREHENSIVE METABOLIC PANEL  TROPONIN I   ____________________________________________  EKG  EKG showed normal sinus rhythm reading was evaluated by heart station doctor. ____________________________________________  RADIOLOGY chest x-ray unremarkable. ____________________________________________   PROCEDURES  Procedure(s) performed: None  Critical Care performed: No  ____________________________________________   INITIAL  IMPRESSION / ASSESSMENT AND PLAN / ED COURSE  Pertinent labs & imaging results that were available during my care of the patient were reviewed by me and considered in my medical decision making (see chart for details).  Costochondritis. Discuss EKG, x-ray results, and lab results with patient. Patient given a prescription for Norflex and naproxen. Discussed etiology of her diagnoses and time for resolution. Advised patient to follow-up with family doctor for continued care. FINAL CLINICAL IMPRESSION(S) / ED DIAGNOSES  Final diagnoses:  Costochondritis     Joni Reining, PA-C 10/13/14 1310  Jene Every, MD 10/13/14 [phone]

## 2014-10-13 NOTE — ED Notes (Signed)
Pt c/o intermittent pain under left breast x 2 days, states pain radiates to left side of neck at times, also states pain is worse with inspiration

## 2014-10-13 NOTE — Telephone Encounter (Signed)
FYI patient went to ED 

## 2014-10-19 ENCOUNTER — Ambulatory Visit (INDEPENDENT_AMBULATORY_CARE_PROVIDER_SITE_OTHER): Payer: BLUE CROSS/BLUE SHIELD | Admitting: Internal Medicine

## 2014-10-19 ENCOUNTER — Encounter: Payer: Self-pay | Admitting: Internal Medicine

## 2014-10-19 VITALS — BP 116/68 | HR 83 | Temp 98.2°F | Ht 62.0 in | Wt 225.0 lb

## 2014-10-19 DIAGNOSIS — N898 Other specified noninflammatory disorders of vagina: Secondary | ICD-10-CM | POA: Diagnosis not present

## 2014-10-19 DIAGNOSIS — E669 Obesity, unspecified: Secondary | ICD-10-CM | POA: Diagnosis not present

## 2014-10-19 DIAGNOSIS — Z0000 Encounter for general adult medical examination without abnormal findings: Secondary | ICD-10-CM

## 2014-10-19 DIAGNOSIS — D509 Iron deficiency anemia, unspecified: Secondary | ICD-10-CM

## 2014-10-19 DIAGNOSIS — Z23 Encounter for immunization: Secondary | ICD-10-CM | POA: Diagnosis not present

## 2014-10-19 DIAGNOSIS — R609 Edema, unspecified: Secondary | ICD-10-CM

## 2014-10-19 MED ORDER — BETAMETHASONE VALERATE 0.1 % EX OINT: 1.0000 "application " | TOPICAL_OINTMENT | Freq: Two times a day (BID) | CUTANEOUS | Status: DC

## 2014-10-19 MED ORDER — VENLAFAXINE HCL ER 150 MG PO CP24: 150.0000 mg | ORAL_CAPSULE | Freq: Every day | ORAL | Status: DC

## 2014-10-19 MED ORDER — SPIRONOLACTONE 25 MG PO TABS: 25.0000 mg | ORAL_TABLET | Freq: Every day | ORAL | Status: DC

## 2014-10-19 NOTE — Progress Notes (Signed)
Patient ID: Lisa Rosales, female    DOB: 01-15-1985  Age: 29 y.o. MRN: [id number]  The patient is here for annual wellness examination and management of other chronic and acute problems.   The risk factors are reflected in the social history.  The roster of all physicians providing medical care to patient - is listed in the Snapshot section of the chart.  Home safety : The patient has smoke detectors in the home. They wear seatbelts.  There are no firearms at home. There is no violence in the home.   There is no risks for hepatitis, STDs or HIV. There is no   history of blood transfusion. They have no travel history to infectious disease endemic areas of the world.  The patient has seen their dentist in the last six month. They have seen their eye doctor in the last year. They admit to slight hearing difficulty with regard to whispered voices and some television programs.  They have deferred audiologic testing in the last year.  They do not  have excessive sun exposure. Discussed the need for sun protection: hats, long sleeves and use of sunscreen if there is significant sun exposure.   Diet: the importance of a healthy diet is discussed. They do have a healthy diet.  The benefits of regular aerobic exercise were discussed. She walks 4 times per week ,  20 minutes.   Depression screen: there are no signs or vegative symptoms of depression- irritability, change in appetite, anhedonia, sadness/tearfullness.  The following portions of the patient's history were reviewed and updated as appropriate: allergies, current medications, past family history, past medical history,  past surgical history, past social history  and problem list.  Visual acuity was not assessed per patient preference since she has regular follow up with her ophthalmologist. Hearing and body mass index were assessed and reviewed.   During the course of the visit the patient was educated and counseled about appropriate screening  and preventive services including : fall prevention , diabetes screening, nutrition counseling, colorectal cancer screening, and recommended immunizations.    CC: The primary encounter diagnosis was Vaginal irritation. Diagnoses of Need for prophylactic vaccination and inoculation against influenza, Microcytic anemia, Visit for preventive health examination, Obesity, and Fluid retention in tissues were also pertinent to this visit.   Some vaginal itching which occurs without discharge the week before mesntruation   History Maddy has a past medical history of Chicken pox (1995).   She has past surgical history that includes Tubal ligation (11/15/2010).   Her family history includes Arthritis in her maternal grandfather and mother; Cancer in her paternal grandmother; Diabetes in her maternal grandfather, maternal grandmother, paternal grandfather, and paternal grandmother; Early death (age of onset: 8038) in her maternal uncle; Heart disease in her maternal grandfather; Hyperlipidemia in her maternal grandfather and maternal grandmother; Hypertension in her maternal grandfather and maternal grandmother; Mental illness in her maternal grandmother; Seizures (age of onset: 6525) in her maternal uncle; Stroke in her maternal grandmother.She reports that she has quit smoking. Her smoking use included Cigarettes. She started smoking about 4 years ago. She has never used smokeless tobacco. She reports that she drinks alcohol. She reports that she does not use illicit drugs.  Outpatient Prescriptions Prior to Visit  Medication Sig Dispense Refill  . atenolol (TENORMIN) 25 MG tablet Take 1 tablet (25 mg total) by mouth daily. At bedtime 30 tablet 1  . chlorpheniramine-HYDROcodone (TUSSIONEX PENNKINETIC ER) 10-8 MG/5ML SUER Take 5 mLs by mouth  at bedtime as needed for cough. 115 mL 0  . ergocalciferol (DRISDOL) 50000 UNITS capsule Take 1 capsule (50,000 Units total) by mouth once a week. 12 capsule 0  .  fluconazole (DIFLUCAN) 150 MG tablet Take 1 tablet (150 mg total) by mouth once. 1 tablet 0  . fluticasone (FLONASE) 50 MCG/ACT nasal spray Place 2 sprays into both nostrils daily. 16 g 6  . naproxen (NAPROSYN) 500 MG tablet Take 1 tablet (500 mg total) by mouth 2 (two) times daily with a meal. 20 tablet 0  . orphenadrine (NORFLEX) 100 MG tablet Take 1 tablet (100 mg total) by mouth 2 (two) times daily. 20 tablet 0  . SUMAtriptan (IMITREX) 25 MG tablet Take 1 tablet (25 mg total) by mouth every 2 (two) hours as needed for migraine or headache. May repeat in 2 hours if headache persists or recurs. 10 tablet 0  . venlafaxine XR (EFFEXOR-XR) 37.5 MG 24 hr capsule Take 1 capsule (37.5 mg total) by mouth daily with breakfast. For one week, then in crease to 2 tablets daily 60 capsule 1   No facility-administered medications prior to visit.    Review of Systems   Patient denies headache, fevers, malaise, unintentional weight loss, skin rash, eye pain, sinus congestion and sinus pain, sore throat, dysphagia,  hemoptysis , cough, dyspnea, wheezing, chest pain, palpitations, orthopnea, edema, abdominal pain, nausea, melena, diarrhea, constipation, flank pain, dysuria, hematuria, urinary  Frequency, nocturia, numbness, tingling, seizures,  Focal weakness, Loss of consciousness,  Tremor, insomnia, depression, anxiety, and suicidal ideation.      Objective:  BP 116/68 mmHg  Pulse 83  Temp(Src) 98.2 F (36.8 C) (Oral)  Ht  (1.575 m)  Wt 225 lb (102.059 kg)  BMI 41.14 kg/m2  SpO2 98%  LMP 10/18/2014  Physical Exam   General appearance: alert, cooperative and appears stated age Head: Normocephalic, without obvious abnormality, atraumatic Eyes: conjunctivae/corneas clear. PERRL, EOM's intact. Fundi benign. Ears: normal TM's and external ear canals both ears Nose: Nares normal. Septum midline. Mucosa normal. No drainage or sinus tenderness. Throat: lips, mucosa, and tongue normal; teeth and  gums normal Neck: no adenopathy, no carotid bruit, no JVD, supple, symmetrical, trachea midline and thyroid not enlarged, symmetric, no tenderness/mass/nodules Lungs: clear to auscultation bilaterally Breasts: normal appearance, no masses or tenderness Heart: regular rate and rhythm, S1, S2 normal, no murmur, click, rub or gallop Abdomen: soft, non-tender; bowel sounds normal; no masses,  no organomegaly Extremities: extremities normal, atraumatic, no cyanosis or edema Pulses: 2+ and symmetric Skin: Skin color, texture, turgor normal. No rashes or lesions Neurologic: Alert and oriented X 3, normal strength and tone. Normal symmetric reflexes. Normal coordination and gait.     Assessment & Plan:   Problem List Items Addressed This Visit    Obesity    I have addressed  BMI and recommended wt loss of 10% of body weight over the next 6 months using a low glycemic index diet and regular exercise a minimum of 5 days per week.        Microcytic anemia    Iron studies pending   Lab Results  Component Value Date   WBC 6.5 10/13/2014   HGB 10.6* 10/13/2014   HCT 33.5* 10/13/2014   MCV 70.1* 10/13/2014   PLT 361 10/13/2014  No results found for: IRON, TIBC, FERRITIN       Relevant Orders   IBC panel   Ferritin   Visit for preventive health examination    Annual  wellness  exam was done as well as a comprehensive physical exam  .  During the course of the visit the patient was educated and counseled about appropriate screening and preventive services and screenings were brought up to date for cervical and breast cancer .  She will return for fasting labs to provide samples for diabetes screening and lipid analysis with projected  10 year  risk for CAD. nutrition counseling, skin cancer screening has been recommended, along with review of the age appropriate recommended immunizations.  Printed recommendations for health maintenance screenings was given.        Fluid retention in tissues     Occurring monthly premenstrually.  Low dose spironolactone.  Lab Results  Component Value Date   NA 137 10/13/2014   K 3.8 10/13/2014   CL 106 10/13/2014   CO2 24 10/13/2014   Lab Results  Component Value Date   CREATININE 0.63 10/13/2014         Vaginal irritation - Primary    Premenstrual  , will try steroid cream        Other Visit Diagnoses    Need for prophylactic vaccination and inoculation against influenza        Relevant Orders    Flu Vaccine QUAD 36+ mos IM (Completed)       I have changed Ms. Valente's venlafaxine XR. I am also having her start on betamethasone valerate ointment and spironolactone. Additionally, I am having her maintain her atenolol, SUMAtriptan, fluconazole, fluticasone, chlorpheniramine-HYDROcodone, ergocalciferol, orphenadrine, and naproxen.  Meds ordered this encounter  Medications  . betamethasone valerate ointment (VALISONE) 0.1 %    Sig: Apply 1 application topically 2 (two) times daily. As needed for itching    Dispense:  30 g    Refill:  0  . venlafaxine XR (EFFEXOR-XR) 150 MG 24 hr capsule    Sig: Take 1 capsule (150 mg total) by mouth daily with breakfast. For one week, then in crease to 2 tablets daily    Dispense:  30 capsule    Refill:  2  . spironolactone (ALDACTONE) 25 MG tablet    Sig: Take 1 tablet (25 mg total) by mouth daily. As needed for fluid retention    Dispense:  30 tablet    Refill:  3    Medications Discontinued During This Encounter  Medication Reason  . venlafaxine XR (EFFEXOR-XR) 37.5 MG 24 hr capsule Reorder    Follow-up: Return in about 6 months (around 04/19/2015).   Sherlene Shams, MD

## 2014-10-19 NOTE — Assessment & Plan Note (Signed)
Premenstrual  , will try steroid cream

## 2014-10-19 NOTE — Patient Instructions (Signed)
You are due for a tetanus booster and an influenza annual vaccine , so you received both today  Spironolactone 25 mg daily AS NEEDED for fluid retention  Effexor dose increased to 150 mg daily,  Take in the morning  Steroid cream twice daily as needed for premenstrual itching   There are MANY diets that will work, but they need to be lifestyle changes accompanied by aerobic exercise  5 days a week to achieve and maintain your goal weight.   The  diet I discussed with you today is the 10 day Green Smoothie Cleansing /Detox Diet by Brooke DareJJ Rosales . available on [employer] for around $10.  This is not a low carb or a weight loss diet,  It is fundamentally a "cleansing" low fat diet that eliminates sugar, gluten, caffeine, alcohol and dairy for 10 days .  What you add back after the initial ten days is entirely up to  you!  You can expect to lose 5 to 10 lbs depending on how strict you are.   I suggest drinking 2 smoothies daily and keeping one chewable meal (but keep it simple, like baked fish and salad, rice or bok choy) .  You snack primarily on fresh  fruit, egg whites and judicious quantities of nuts. You can add vegetable based protein powder (nothing with whey , since whey is dairy) in it.  [employer] has a Research officer, trade uniongreat selection .  It does require some form of a nutrient extractor (Vita Mix, a electric juicer,  Or a Nutribullet Rx).  i have found that using frozen fruits is much more convenient and cost effective. You can even find plenty of organic fruit in the frozen fruit section of [employer].  Just thaw what you need for the following day the night before in the refrigerator (to avoid jamming up your juicer/blender)

## 2014-10-20 ENCOUNTER — Encounter: Payer: Self-pay | Admitting: Internal Medicine

## 2014-10-20 DIAGNOSIS — R609 Edema, unspecified: Secondary | ICD-10-CM | POA: Insufficient documentation

## 2014-10-20 NOTE — Assessment & Plan Note (Signed)
Occurring monthly premenstrually.  Low dose spironolactone.  Lab Results  Component Value Date   NA 137 10/13/2014   K 3.8 10/13/2014   CL 106 10/13/2014   CO2 24 10/13/2014   Lab Results  Component Value Date   CREATININE 0.63 10/13/2014

## 2014-10-20 NOTE — Assessment & Plan Note (Signed)

## 2014-10-20 NOTE — Assessment & Plan Note (Signed)
I have addressed  BMI and recommended wt loss of 10% of body weight over the next 6 months using a low glycemic index diet and regular exercise a minimum of 5 days per week.   

## 2014-10-20 NOTE — Assessment & Plan Note (Signed)
Iron studies pending   Lab Results  Component Value Date   WBC 6.5 10/13/2014   HGB 10.6* 10/13/2014   HCT 33.5* 10/13/2014   MCV 70.1* 10/13/2014   PLT 361 10/13/2014  No results found for: IRON, TIBC, FERRITIN

## 2014-12-20 ENCOUNTER — Other Ambulatory Visit: Payer: Self-pay | Admitting: Internal Medicine
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
## 2016-12-16 IMAGING — CR DG CHEST 2V
2 series · 2 of 2 positions shown · non-contrast
Comparison: None.

CLINICAL DATA: chest pain

EXAM:
CHEST  2 VIEW

[chest pa]
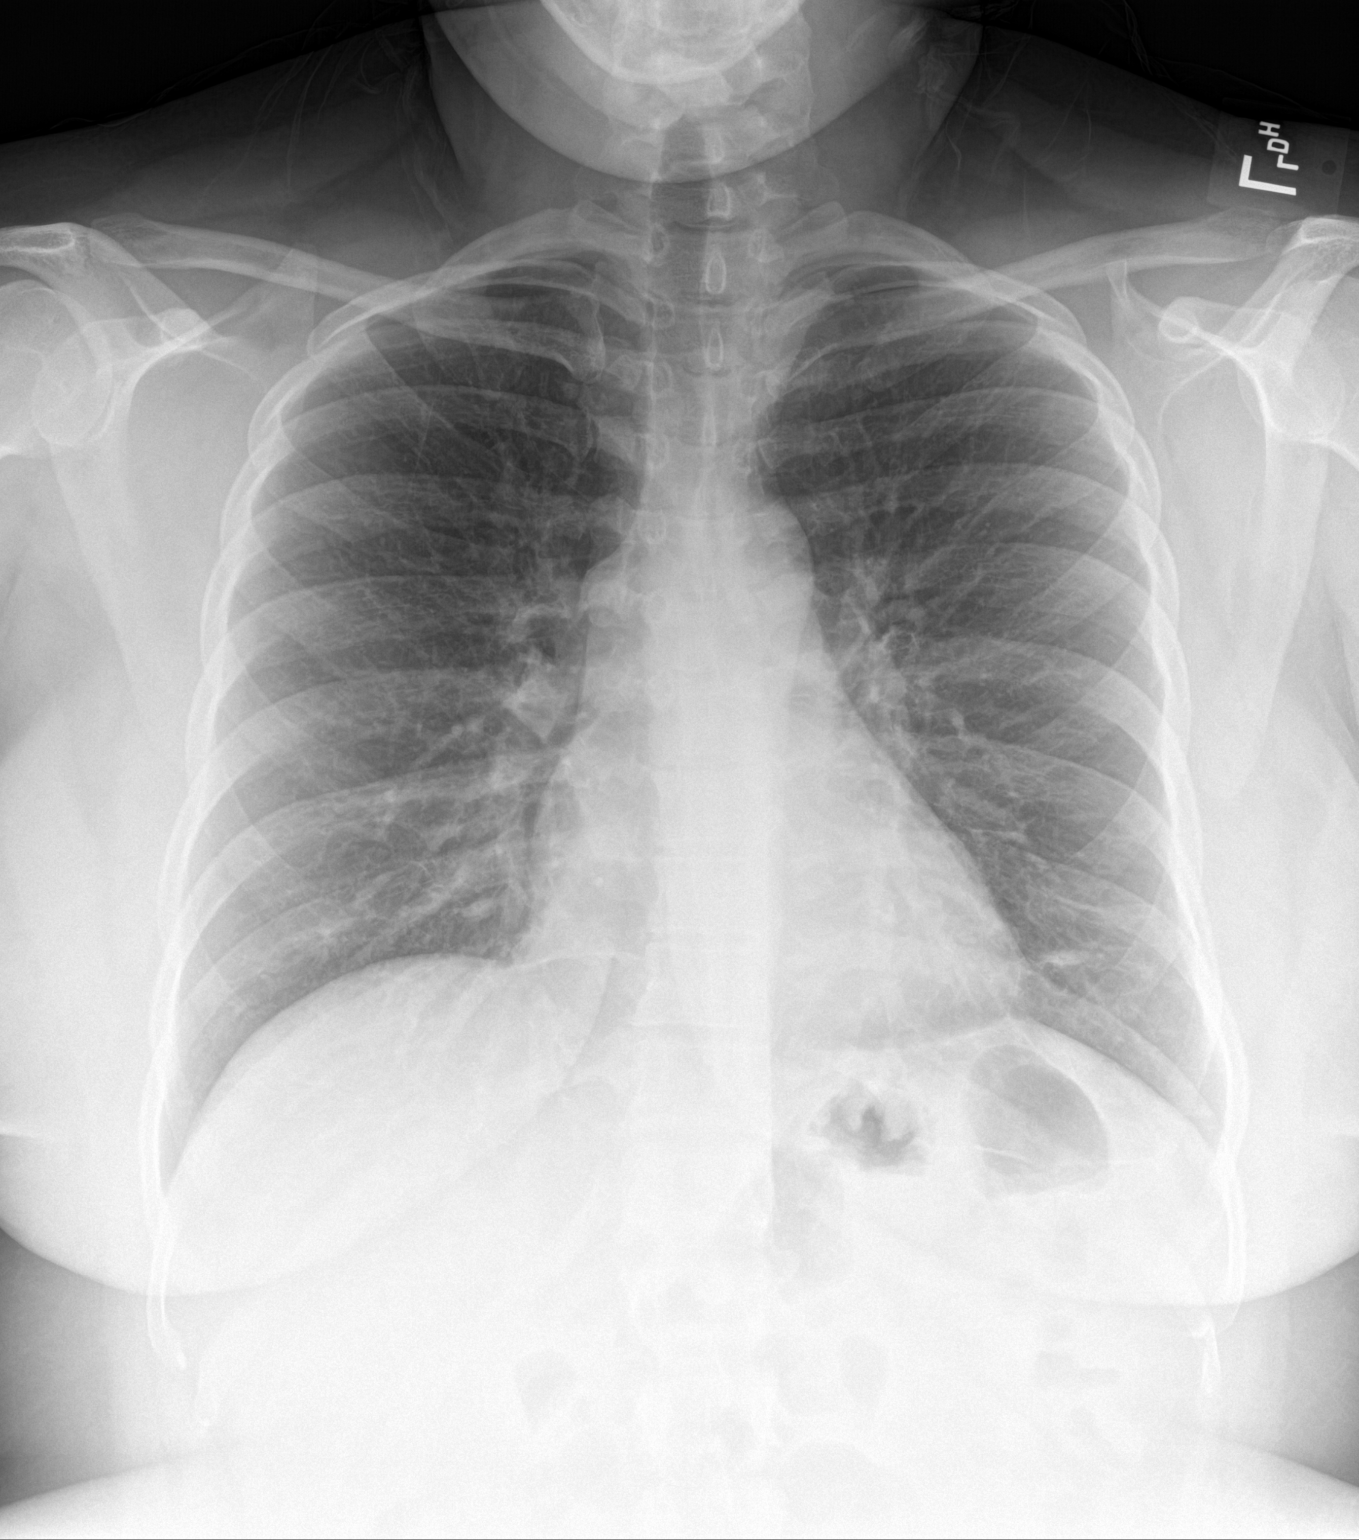

[chest lat]
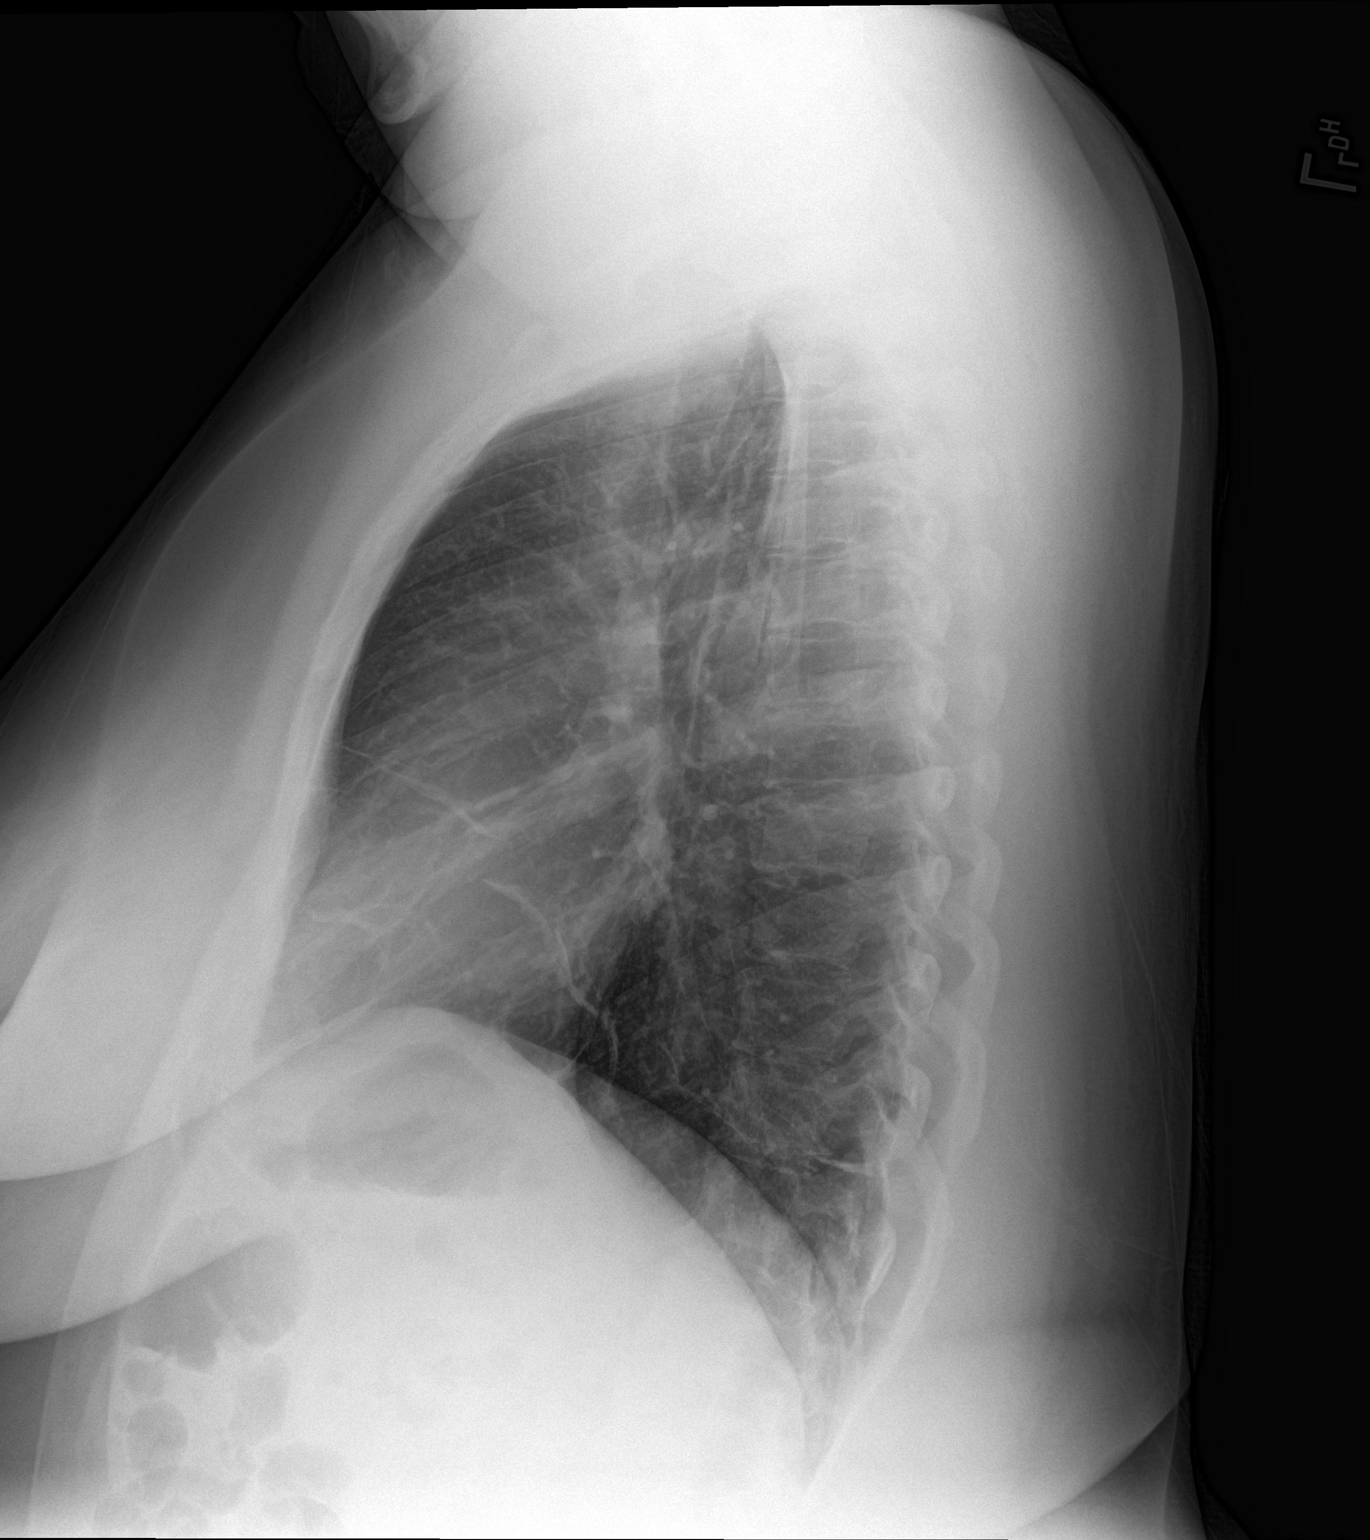

[2 of 2 positions shown; findings below may reference images not displayed]

FINDINGS: Heart size and vascularity normal. Linear densities in the lingula
compatible with scarring. Negative for pneumonia or effusion. No
mass lesion.
IMPRESSION: No active cardiopulmonary disease.

## 2017-09-22 ENCOUNTER — Encounter: Payer: Self-pay | Admitting: Emergency Medicine

## 2017-09-22 ENCOUNTER — Other Ambulatory Visit: Payer: Self-pay

## 2017-09-22 ENCOUNTER — Emergency Department
Admission: EM | Admit: 2017-09-22 | Discharge: 2017-09-22 | Disposition: A | Discharge status: Home / Self Care | Payer: BLUE CROSS/BLUE SHIELD | Attending: Emergency Medicine | Admitting: Emergency Medicine

## 2017-09-22 DIAGNOSIS — Y999 Unspecified external cause status: Secondary | ICD-10-CM | POA: Insufficient documentation

## 2017-09-22 DIAGNOSIS — Y92018 Other place in single-family (private) house as the place of occurrence of the external cause: Secondary | ICD-10-CM | POA: Insufficient documentation

## 2017-09-22 DIAGNOSIS — Z79899 Other long term (current) drug therapy: Secondary | ICD-10-CM | POA: Insufficient documentation

## 2017-09-22 DIAGNOSIS — Y9389 Activity, other specified: Secondary | ICD-10-CM | POA: Insufficient documentation

## 2017-09-22 DIAGNOSIS — Z87891 Personal history of nicotine dependence: Secondary | ICD-10-CM | POA: Insufficient documentation

## 2017-09-22 DIAGNOSIS — S0992XA Unspecified injury of nose, initial encounter: Secondary | ICD-10-CM

## 2017-09-22 DIAGNOSIS — S022XXA Fracture of nasal bones, initial encounter for closed fracture: Secondary | ICD-10-CM | POA: Insufficient documentation

## 2017-09-22 MED ORDER — IBUPROFEN 600 MG PO TABS
600.0000 mg | ORAL_TABLET | ORAL | Status: AC
  Administered 2017-09-22: 600 mg via ORAL
  Filled 2017-09-22: qty 1
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
## 2017-09-22 NOTE — ED Notes (Signed)
Pt signed paper copy of discharge 
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
## 2018-11-06 ENCOUNTER — Other Ambulatory Visit: Payer: Self-pay

## 2018-11-06 ENCOUNTER — Encounter (HOSPITAL_COMMUNITY): Payer: Self-pay | Admitting: Emergency Medicine

## 2018-11-06 ENCOUNTER — Ambulatory Visit (HOSPITAL_COMMUNITY)
Admission: EM | Admit: 2018-11-06 | Discharge: 2018-11-06 | Disposition: A | Discharge status: Home / Self Care | Payer: Self-pay | Attending: Family Medicine | Admitting: Family Medicine

## 2018-11-06 DIAGNOSIS — Z87891 Personal history of nicotine dependence: Secondary | ICD-10-CM | POA: Insufficient documentation

## 2018-11-06 DIAGNOSIS — Z20828 Contact with and (suspected) exposure to other viral communicable diseases: Secondary | ICD-10-CM | POA: Diagnosis not present

## 2018-11-06 DIAGNOSIS — Z809 Family history of malignant neoplasm, unspecified: Secondary | ICD-10-CM | POA: Insufficient documentation

## 2018-11-06 DIAGNOSIS — R062 Wheezing: Secondary | ICD-10-CM | POA: Insufficient documentation

## 2018-11-06 DIAGNOSIS — Z833 Family history of diabetes mellitus: Secondary | ICD-10-CM | POA: Insufficient documentation

## 2018-11-06 DIAGNOSIS — J069 Acute upper respiratory infection, unspecified: Secondary | ICD-10-CM | POA: Insufficient documentation

## 2018-11-06 DIAGNOSIS — Z885 Allergy status to narcotic agent status: Secondary | ICD-10-CM | POA: Insufficient documentation

## 2018-11-06 DIAGNOSIS — R05 Cough: Secondary | ICD-10-CM | POA: Insufficient documentation

## 2018-11-06 MED ORDER — ALBUTEROL SULFATE HFA 108 (90 BASE) MCG/ACT IN AERS: 1.0000 | INHALATION_SPRAY | Freq: Four times a day (QID) | RESPIRATORY_TRACT | 0 refills | Status: DC | PRN

## 2018-11-06 NOTE — ED Provider Notes (Signed)
[place]   [id number] 11/06/18 Arrival Time: 2993  ASSESSMENT & PLAN:  1. Viral URI with cough   2. Wheezing     No SOB. COVID-19 testing sent. To self-quarantine until results are available. Work note provided.  Meds ordered this encounter  Medications  . albuterol (VENTOLIN HFA) 108 (90 Base) MCG/ACT inhaler    Sig: Inhale 1-2 puffs into the lungs every 6 (six) hours as needed for wheezing or shortness of breath.    Dispense:  18 g    Refill:  0   OTC symptom care as needed.  Follow-up Information    [name], MD.   Specialty: Internal Medicine Why: As needed. Contact information: [facility] [place] [address] [phone]           Reviewed expectations re: course of current medical issues. Questions answered. Outlined signs and symptoms indicating need for more acute intervention. Patient verbalized understanding. After Visit Summary given.   SUBJECTIVE: History from: patient. Lisa Rosales is a 33 y.o. female who reports abrupt onset of nasal congestion, coughing, body aches, and fatigue approx 3-4 d ago. Does report some wheezing, esp with coughing spells. Using her son's albuterol with good relief. No specific SOB or CP reported. Known COVID-19 contact: none. Recent travel: none. Denies: sore throat and headache. Normal PO intake without n/v. No diarrhea. Ambulatory without difficulty. No specific aggravating or alleviating factors reported. OTC cold medications with some help.  ROS: As per HPI. All other systems negative.    OBJECTIVE:  Vitals:   11/06/18 1051  BP: 139/79  Pulse: 95  Resp: 18  Temp: 97.8 F (36.6 C)  TempSrc: Temporal  SpO2: 99%    General appearance: alert; no distress; appears fatigued Eyes: PERRLA; EOMI; conjunctiva normal HENT: [place]; AT; nasal mucosa normal; oral mucosa normal Neck: supple  Lungs: mild bilateral expiratory wheezing; dry cough; unlabored respirations Heart: regular  rate and rhythm Abdomen: soft, non-tender Extremities: no edema Skin: warm and dry Neurologic: normal gait Psychological: alert and cooperative; normal mood and affect  Pending: Labs Reviewed  NOVEL CORONAVIRUS, NAA (HOSP ORDER, SEND-OUT TO REF LAB; TAT 18-24 HRS)     Allergies  Allergen Reactions  . Morphine And Related Palpitations    Sweats and halucinations    Past Medical History:  Diagnosis Date  . Chicken pox 1995   Social History   Socioeconomic History  . Marital status: Married    Spouse name: Not on file  . Number of children: Not on file  . Years of education: Not on file  . Highest education level: Not on file  Occupational History  . Not on file  Social Needs  . Financial resource strain: Not on file  . Food insecurity    Worry: Not on file    Inability: Not on file  . Transportation needs    Medical: Not on file    Non-medical: Not on file  Tobacco Use  . Smoking status: Former Smoker    Types: Cigarettes    Start date: 10/14/2010  . Smokeless tobacco: Never Used  . Tobacco comment: Occasionally  Substance and Sexual Activity  . Alcohol use: Yes    Comment: occassional  . Drug use: No  . Sexual activity: Yes  Lifestyle  . Physical activity    Days per week: Not on file    Minutes per session: Not on file  . Stress: Not on file  Relationships  . Social connections  Talks on phone: Not on file    Gets together: Not on file    Attends religious service: Not on file    Active member of club or organization: Not on file    Attends meetings of clubs or organizations: Not on file    Relationship status: Not on file  . Intimate partner violence    Fear of current or ex partner: Not on file    Emotionally abused: Not on file    Physically abused: Not on file    Forced sexual activity: Not on file  Other Topics Concern  . Not on file  Social History Narrative  . Not on file   Family History  Problem Relation Age of Onset  . Arthritis  Mother   . Early death Maternal Uncle 88       pulmonary embolism  . Seizures Maternal Uncle 25       25-30 seizure onset  . Hyperlipidemia Maternal Grandmother   . Hypertension Maternal Grandmother   . Stroke Maternal Grandmother   . Mental illness Maternal Grandmother   . Diabetes Maternal Grandmother   . Arthritis Maternal Grandfather   . Hyperlipidemia Maternal Grandfather   . Heart disease Maternal Grandfather   . Hypertension Maternal Grandfather   . Diabetes Maternal Grandfather   . Cancer Paternal Grandmother   . Diabetes Paternal Grandmother   . Diabetes Paternal Grandfather    Past Surgical History:  Procedure Laterality Date  . TUBAL LIGATION  11/15/2010     Mardella Layman, MD 11/06/18 [phone]

## 2018-11-06 NOTE — Discharge Instructions (Signed)
If your Covid-19 test is positive, you will get a phone call from [facility] regarding your results. If your Covid-19 test is negative, you will NOT get a phone call from [facility] with your results. You may view your results on MyChart. If you do not have a MyChart account, sign up instructions are in your discharge papers. ° °

## 2018-11-06 NOTE — ED Triage Notes (Signed)
Pt reports a cough that started on Sunday night.  She states she had a fever of 100.4 yesterday.  She also has nasal congestion.  She also reports body aches, hot to cold.    She has been taking tylenol and theraflu.  She states the coughing has gotten better and she hasn't had a fever since yesterday.  She denies N, V, D, or loss of taste or smell.

## 2018-11-08 LAB — NOVEL CORONAVIRUS, NAA (HOSP ORDER, SEND-OUT TO REF LAB; TAT 18-24 HRS): SARS-CoV-2, NAA: NOT DETECTED

## 2020-12-07 ENCOUNTER — Other Ambulatory Visit: Payer: Self-pay

## 2020-12-07 ENCOUNTER — Encounter (HOSPITAL_COMMUNITY): Payer: Self-pay

## 2020-12-07 ENCOUNTER — Ambulatory Visit (HOSPITAL_COMMUNITY)
Admission: EM | Admit: 2020-12-07 | Discharge: 2020-12-07 | Disposition: A | Discharge status: Home / Self Care | Payer: Managed Care, Other (non HMO) | Attending: Family | Admitting: Family

## 2020-12-07 DIAGNOSIS — S61412A Laceration without foreign body of left hand, initial encounter: Secondary | ICD-10-CM

## 2020-12-07 DIAGNOSIS — Z23 Encounter for immunization: Secondary | ICD-10-CM | POA: Diagnosis not present

## 2020-12-07 DIAGNOSIS — M79642 Pain in left hand: Secondary | ICD-10-CM

## 2020-12-07 MED ORDER — CEPHALEXIN 500 MG PO CAPS: 500.0000 mg | ORAL_CAPSULE | Freq: Two times a day (BID) | ORAL | 0 refills | Status: AC

## 2020-12-07 MED ORDER — BACITRACIN ZINC 500 UNIT/GM EX OINT
TOPICAL_OINTMENT | CUTANEOUS | Status: AC
  Filled 2020-12-07: qty 3.6

## 2020-12-07 MED ORDER — LIDOCAINE HCL 2 % IJ SOLN
INTRAMUSCULAR | Status: AC
  Filled 2020-12-07: qty 20

## 2020-12-07 MED ORDER — FLUCONAZOLE 150 MG PO TABS: 150.0000 mg | ORAL_TABLET | Freq: Once | ORAL | 0 refills | Status: AC

## 2020-12-07 MED ORDER — TETANUS-DIPHTH-ACELL PERTUSSIS 5-2.5-18.5 LF-MCG/0.5 IM SUSY
PREFILLED_SYRINGE | INTRAMUSCULAR | Status: AC
  Filled 2020-12-07: qty 0.5

## 2020-12-07 MED ORDER — TETANUS-DIPHTH-ACELL PERTUSSIS 5-2.5-18.5 LF-MCG/0.5 IM SUSY
0.5000 mL | PREFILLED_SYRINGE | Freq: Once | INTRAMUSCULAR | Status: AC
  Administered 2020-12-07: 0.5 mL via INTRAMUSCULAR

## 2020-12-07 NOTE — Discharge Instructions (Addendum)
Recommend keep area covered and dry for the next 24 hours. Then may wash area with soap and water. Keep area covered when at work. May start Keflex 500mg  twice a day for 5 days to prevent infection. May take Diflucan 150mg  once tomorrow then may repeat 1 tablet in 3 days if needed to prevent antibiotic associated yeast infection. May take OTC Ibuprofen 600mg  every 6 hours as needed for pain and swelling. If any increase in pain, swelling, redness or discharge occurs, return for recheck. Otherwise, follow-up in 7 to 10 days for suture removal.

## 2020-12-07 NOTE — ED Provider Notes (Signed)
Lisa Rosales    CSN: [id number] Arrival date & time: 12/07/20  [id number]      History   Chief Complaint No chief complaint on file.   HPI Lisa Rosales is a 35 y.o. female.   35 year old female presents with injury to her left hand. She had finished her shift this morning at [facility] and was changing the blade on a box cutter in preparation for her next shift when the blade slipped and cut her left palm of her hand. She has controlled the bleeding but it is painful and concerned over need for sutures. No numbness. Able to move and flex all fingers. Uncertain when last Tetanus was given. Has not taken any medication yet for pain. No other current chronic health issues. Takes no daily medication.   The history is provided by the patient.   Past Medical History:  Diagnosis Date   Chicken pox 1995    Patient Active Problem List   Diagnosis Date Noted   Fluid retention in tissues 10/20/2014   Screening for diabetes mellitus 09/24/2014   Vaginal irritation 09/24/2014   Plantar fasciitis 09/24/2014   Viral URI with cough 09/24/2014   Visit for preventive health examination 11/28/2013   Microcytic anemia 10/16/2013   Obesity 10/14/2013   Migraine headache with aura 10/14/2013   Adjustment disorder with mixed anxiety and depressed mood 10/14/2013    Past Surgical History:  Procedure Laterality Date   TUBAL LIGATION  11/15/2010    OB History   No obstetric history on file.      Home Medications    Prior to Admission medications   Medication Sig Start Date End Date Taking? Authorizing Provider  cephALEXin (KEFLEX) 500 MG capsule Take 1 capsule (500 mg total) by mouth 2 (two) times daily for 5 days. 12/07/20 12/12/20 Yes [place] Valli, Nicholes Stairs, NP  Vitamin D, Ergocalciferol, (DRISDOL) 50000 UNITS CAPS capsule TAKE 1 CAPSULE (50,000 UNITS TOTAL) BY MOUTH ONCE A WEEK. 12/21/14  Yes [name], MD  atenolol (TENORMIN) 25 MG tablet Take 1 tablet (25 mg total) by  mouth daily. At bedtime 10/13/13 11/06/18  [name], MD  spironolactone (ALDACTONE) 25 MG tablet Take 1 tablet (25 mg total) by mouth daily. As needed for fluid retention 10/19/14 11/06/18  [name], MD  SUMAtriptan (IMITREX) 25 MG tablet Take 1 tablet (25 mg total) by mouth every 2 (two) hours as needed for migraine or headache. May repeat in 2 hours if headache persists or recurs. 10/13/13 11/06/18  [name], MD  venlafaxine XR (EFFEXOR-XR) 150 MG 24 hr capsule Take 1 capsule (150 mg total) by mouth daily with breakfast. For one week, then in crease to 2 tablets daily 10/19/14 11/06/18  [name], MD    Family History Family History  Problem Relation Age of Onset   Arthritis Mother    Early death Maternal Uncle 70       pulmonary embolism   Seizures Maternal Uncle 43       25-30 seizure onset   Hyperlipidemia Maternal Grandmother    Hypertension Maternal Grandmother    Stroke Maternal Grandmother    Mental illness Maternal Grandmother    Diabetes Maternal Grandmother    Arthritis Maternal Grandfather    Hyperlipidemia Maternal Grandfather    Heart disease Maternal Grandfather    Hypertension Maternal Grandfather    Diabetes Maternal Grandfather    Cancer Paternal Grandmother    Diabetes Paternal Grandmother  Diabetes Paternal Grandfather     Social History Social History   Tobacco Use   Smoking status: Some Days    Types: Cigarettes    Start date: 10/14/2010   Smokeless tobacco: Never   Tobacco comments:    Occasionally  Vaping Use   Vaping Use: Never used  Substance Use Topics   Alcohol use: Yes    Comment: occassional   Drug use: No     Allergies   Morphine and related   Review of Systems Review of Systems  Constitutional:  Negative for chills and fever.  Eyes:  Negative for photophobia and visual disturbance.  Gastrointestinal:  Negative for nausea and vomiting.  Musculoskeletal:  Negative for arthralgias and joint swelling.   Skin:  Positive for wound.  Allergic/Immunologic: Negative for environmental allergies, food allergies and immunocompromised state.  Neurological:  Negative for dizziness, tremors, seizures, syncope, speech difficulty, weakness, light-headedness and numbness.  Hematological:  Negative for adenopathy. Does not bruise/bleed easily.    Physical Exam Triage Vital Signs ED Triage Vitals  Enc Vitals Group     BP 12/07/20 1010 (!) 132/92     Pulse Rate 12/07/20 1010 69     Resp 12/07/20 1010 18     Temp 12/07/20 1010 97.9 F (36.6 C)     Temp Source 12/07/20 1010 Oral     SpO2 12/07/20 1010 100 %     Weight 12/07/20 1007 140 lb (63.5 kg)     Height 12/07/20 1007 5' (1.524 m)     Head Circumference --      Peak Flow --      Pain Score 12/07/20 1007 6     Pain Loc --      Pain Edu? --      Excl. in [place]? --    No data found.  Updated Vital Signs BP (!) 132/92 (BP Location: Left Arm)   Pulse 69   Temp 97.9 F (36.6 C) (Oral)   Resp 18   Ht 5' (1.524 m)   Wt 140 lb (63.5 kg)   LMP 11/23/2020   SpO2 100%   BMI 27.34 kg/m   Visual Acuity Right Eye Distance:   Left Eye Distance:   Bilateral Distance:    Right Eye Near:   Left Eye Near:    Bilateral Near:     Physical Exam Vitals and nursing note reviewed.  Constitutional:      General: She is awake. She is not in acute distress.    Appearance: She is well-developed and well-groomed.     Comments: She is sitting on the exam table in no acute distress.   HENT:     Head: Normocephalic and atraumatic.     Right Ear: Hearing normal.     Left Ear: Hearing normal.  Eyes:     Extraocular Movements: Extraocular movements intact.     Conjunctiva/sclera: Conjunctivae normal.  Cardiovascular:     Rate and Rhythm: Normal rate.  Pulmonary:     Effort: Pulmonary effort is normal.  Musculoskeletal:        General: Normal range of motion.     Right hand: Normal.     Left hand: Laceration and tenderness present. No swelling.  Normal range of motion. Normal strength. Normal sensation. Normal capillary refill. Normal pulse.       Hands:     Comments: About 4cm straight laceration present on left palm starting more mid palm and extending toward ulnar aspect of hand. Edges well approximated  at mid palm, more gaping near ulnar end. Tender at site of laceration. Has full range of motion of hand and fingers. Good pulses and capillary refill. No neuro deficits noted.   Skin:    General: Skin is warm.     Capillary Refill: Capillary refill takes less than 2 seconds.     Findings: Laceration present. No abrasion, bruising, burn, ecchymosis or petechiae.  Neurological:     General: No focal deficit present.     Mental Status: She is alert and oriented to person, place, and time.     Sensory: Sensation is intact. No sensory deficit.     Motor: Motor function is intact.  Psychiatric:        Mood and Affect: Mood normal.        Behavior: Behavior normal. Behavior is cooperative.        Thought Content: Thought content normal.        Judgment: Judgment normal.    UC Treatments / Results  Labs (all labs ordered are listed, but only abnormal results are displayed) Labs Reviewed - No data to display  EKG   Radiology No results found.  Procedures Laceration Repair  Date/Time: 12/07/2020 12:01 PM Performed by: Lisa Apo, NP Authorized by: Lisa Apo, NP   Consent:    Consent obtained:  Verbal   Consent given by:  Patient   Risks, benefits, and alternatives were discussed: yes     Risks discussed:  Infection, pain and poor cosmetic result   Alternatives discussed:  Observation and no treatment Universal protocol:    Procedure explained and questions answered to patient or proxy's satisfaction: yes     Patient identity confirmed:  Verbally with patient Anesthesia:    Anesthesia method:  Local infiltration   Local anesthetic:  Lidocaine 2% w/o epi (Used 87ml) Laceration details:    Location:  Hand    Hand location:  L palm   Length (cm):  4   Depth (mm):  2 Pre-procedure details:    Preparation:  Patient was prepped and draped in usual sterile fashion Exploration:    Hemostasis achieved with:  Direct pressure   Wound exploration: wound explored through full range of motion     Wound extent: no foreign bodies/material noted, no muscle damage noted, no nerve damage noted, no tendon damage noted and no vascular damage noted     Wound extent comment:  Mostly superficial Treatment:    Area cleansed with:  Saline and Shur-Clens   Amount of cleaning:  Standard   Irrigation solution:  Sterile saline   Irrigation volume:  81ml   Irrigation method:  Syringe   Foreign body removal: No foreign material present.     Debridement:  None   Scar revision: no   Skin repair:    Repair method:  Sutures   Suture size:  4-0   Suture material:  Nylon   Suture technique:  Simple interrupted   Number of sutures:  6 Approximation:    Approximation:  Close Repair type:    Repair type:  Simple Post-procedure details:    Dressing:  Antibiotic ointment, non-adherent dressing and bulky dressing   Procedure completion:  Tolerated well, no immediate complications Comments:     RN cleaned wound initially and controlled bleeding. Used Nylon sutures- was clear and not black as usual but was already in sterile field when starting to apply sutures- continued to use but left ends of knots (tails) of simple interrupted sutures long to  assist with locating sutures for time of removal. Patient tolerated procedure very well.  (including critical care time)  Medications Ordered in UC Medications  Tdap (BOOSTRIX) injection 0.5 mL (0.5 mLs Intramuscular Given 12/07/20 1159)    Initial Impression / Assessment and Plan / UC Course  I have reviewed the triage vital signs and the nursing notes.  Pertinent labs & imaging results that were available during my care of the patient were reviewed by me and considered in my  medical decision making (see chart for details).     Recommend keep wound area clean and dry for first 24 hours. Then may wash gently with soap and water. Apply bandage and keep covered when working. Tetanus booster given. Due to nature of environment (dusty/dirty boxes), will place patient on prophylactic antibiotics to help prevent infection. May start Keflex 500mg  twice a day for 5 days. May also take Diflucan 150mg  one dose tomorrow and may repeat another dose in 3 days if needed to prevent antibiotic-induced yeast infection. May take OTC Ibuprofen 600mg  every 6 to 8 hours as needed for pain. Note written for work. If any increase in pain, swelling, redness or discharge occurs at site of laceration, return for recheck. Otherwise, follow-up in 7 to 10 days for suture removal.  Final Clinical Impressions(s) / UC Diagnoses   Final diagnoses:  Laceration of left hand without foreign body, initial encounter  Left hand pain     Discharge Instructions      Recommend keep area covered and dry for the next 24 hours. Then may wash area with soap and water. Keep area covered when at work. May start Keflex 500mg  twice a day for 5 days to prevent infection. May take Diflucan 150mg  once tomorrow then may repeat 1 tablet in 3 days if needed to prevent antibiotic associated yeast infection. May take OTC Ibuprofen 600mg  every 6 hours as needed for pain and swelling. If any increase in pain, swelling, redness or discharge occurs, return for recheck. Otherwise, follow-up in 7 to 10 days for suture removal.     ED Prescriptions     Medication Sig Dispense Auth. Provider   cephALEXin (KEFLEX) 500 MG capsule Take 1 capsule (500 mg total) by mouth 2 (two) times daily for 5 days. 10 capsule , NP   fluconazole (DIFLUCAN) 150 MG tablet Take 1 tablet (150 mg total) by mouth once for 1 dose. May repeat 1 dose in 3 days to prevent antibiotic-induced yeast infection. 2 tablet Taffy Delconte, , NP       PDMP not reviewed this encounter.   , NP 12/08/20 [phone]

## 2020-12-07 NOTE — ED Triage Notes (Signed)
Pt c/o cut along the palm of left hand. Pt states that she was changing the blade for a box cutter at [employer] when the blade had slipped. Bleeding has stopped. Pt states that it is sore to touch. Pt can move her hand and bend fingers.

## 2020-12-13 ENCOUNTER — Ambulatory Visit (INDEPENDENT_AMBULATORY_CARE_PROVIDER_SITE_OTHER): Payer: Self-pay | Admitting: Adult Health

## 2020-12-13 DIAGNOSIS — Z91199 Patient's noncompliance with other medical treatment and regimen due to unspecified reason: Secondary | ICD-10-CM

## 2020-12-13 NOTE — Progress Notes (Signed)
   Complete physical exam  Patient: Lisa Rosales   DOB: 10/22/1999   35 y.o. Female  MRN: [id number]  Subjective:    No chief complaint on file.   Lisa Rosales is a 35 y.o. female who presents today for a complete physical exam. She reports consuming a {diet types:17450} diet. {types:19826} She generally feels {DESC; WELL/FAIRLY WELL/POORLY:18703}. She reports sleeping {DESC; WELL/FAIRLY WELL/POORLY:18703}. She {does/does not:200015} have additional problems to discuss today.    Most recent fall risk assessment:    06/29/2021   10:42 AM  Fall Risk   Falls in the past year? 0  Number falls in past yr: 0  Injury with Fall? 0  Risk for fall due to : No Fall Risks  Follow up Falls evaluation completed     Most recent depression screenings:    06/29/2021   10:42 AM 05/20/2020   10:46 AM  PHQ 2/9 Scores  PHQ - 2 Score 0 0  PHQ- 9 Score 5     {VISON DENTAL STD PSA (Optional):27386}  {History (Optional):23778}  Patient Care Team: Jessup, Joy, NP as PCP - General (Nurse Practitioner)   Outpatient Medications Prior to Visit  Medication Sig   fluticasone (FLONASE) 50 MCG/ACT nasal spray Place 2 sprays into both nostrils in the morning and at bedtime. After 7 days, reduce to once daily.   norgestimate-ethinyl estradiol (SPRINTEC 28) 0.25-35 MG-MCG tablet Take 1 tablet by mouth daily.   Nystatin POWD Apply liberally to affected area 2 times per day   spironolactone (ALDACTONE) 100 MG tablet Take 1 tablet (100 mg total) by mouth daily.   No facility-administered medications prior to visit.    ROS        Objective:     There were no vitals taken for this visit. {Vitals History (Optional):23777}  Physical Exam   No results found for any visits on 08/04/21. {Show previous labs (optional):23779}    Assessment & Plan:    Routine Health Maintenance and Physical Exam  Immunization History  Administered Date(s) Administered   DTaP 01/05/1999, 03/03/1999,  05/12/1999, 01/26/2000, 08/11/2003   Hepatitis A 06/07/2007, 06/12/2008   Hepatitis B 10/23/1998, 11/30/1998, 05/12/1999   HiB (PRP-OMP) 01/05/1999, 03/03/1999, 05/12/1999, 01/26/2000   IPV 01/05/1999, 03/03/1999, 10/31/1999, 08/11/2003   Influenza,inj,Quad PF,6+ Mos 09/12/2013   Influenza-Unspecified 12/13/2011   MMR 10/30/2000, 08/11/2003   Meningococcal Polysaccharide 06/12/2011   Pneumococcal Conjugate-13 01/26/2000   Pneumococcal-Unspecified 05/12/1999, 07/26/1999   Tdap 06/12/2011   Varicella 10/31/1999, 06/07/2007    Health Maintenance  Topic Date Due   HIV Screening  Never done   Hepatitis C Screening  Never done   INFLUENZA VACCINE  08/02/2021   PAP-Cervical Cytology Screening  08/04/2021 (Originally 10/22/2019)   PAP SMEAR-Modifier  08/04/2021 (Originally 10/22/2019)   TETANUS/TDAP  08/04/2021 (Originally 06/11/2021)   HPV VACCINES  Discontinued   COVID-19 Vaccine  Discontinued    Discussed health benefits of physical activity, and encouraged her to engage in regular exercise appropriate for her age and condition.  Problem List Items Addressed This Visit   None Visit Diagnoses     Annual physical exam    -  Primary   Cervical cancer screening       Need for Tdap vaccination          No follow-ups on file.     Joy Jessup, NP
